# Patient Record
Sex: Female | Born: 1955
Health system: Western US, Academic
[De-identification: ages and names within clinical notes are randomized; demographics above are authoritative.]

---

## 2019-04-28 ENCOUNTER — Ambulatory Visit: Payer: PRIVATE HEALTH INSURANCE

## 2019-04-28 DIAGNOSIS — F908 Attention-deficit hyperactivity disorder, other type: Secondary | ICD-10-CM

## 2019-04-28 DIAGNOSIS — E7849 Other hyperlipidemia: Secondary | ICD-10-CM

## 2019-04-28 DIAGNOSIS — Z1231 Encounter for screening mammogram for malignant neoplasm of breast: Secondary | ICD-10-CM

## 2019-04-28 DIAGNOSIS — F321 Major depressive disorder, single episode, moderate: Secondary | ICD-10-CM

## 2019-04-28 MED ORDER — AMPHETAMINE-DEXTROAMPHETAMINE 15 MG PO TABS
15 mg | ORAL_TABLET | Freq: Three times a day (TID) | ORAL | 0 refills | Status: AC
Start: 2019-04-28 — End: ?

## 2019-04-28 NOTE — Progress Notes
[facility] INTERNAL MEDICINE & PEDIATRICS  [facility]  [address]  [place] [address]  Phone: [phone]  FAX: [phone]    Establish care note    Subjective:     CC: Establish Care and Medication Refill    Patient Active Problem List   Diagnosis   ? Hyperlipemia   ? Attention deficit disorder       HPI:  Tiffany Richards is a 63 y.o. female with the above issues, here for Establish Care and Medication Refill.    ADHD: on adderall 15 mg TID (before breakfast, at noon, and at 4pm). has been on this regimen since 2007. dx with ADHD at 63 yo by a psychiatrist. last fill at her previous PCP in [place] Dr. Gevena Mart either 01/2019 per care everywhere. Moved to [place] for husnabdwork. Going to be here for at least a year. Previous PCP passed, has not been on meds for last month however is unable to focus on her job. Requesting refill on medication    HLD: lipitor 20 mg qd    PHQ9 score 11, hx of son dying at a young age. No si/hi. Interested in talk therapy but not pharmaco therapy    Per patient, due for mmg and physical.     Review of Systems  A 14 point review of systems was completed and is negative except as above.    Social History  Social History     Socioeconomic History   ? Marital status: Married     Spouse name: Not on file   ? Number of children: Not on file   ? Years of education: Not on file   ? Highest education level: Not on file   Occupational History   ? Not on file   Social Needs   ? Financial resource strain: Not on file   ? Food insecurity:     Worry: Not on file     Inability: Not on file   ? Transportation needs:     Medical: Not on file     Non-medical: Not on file   Tobacco Use   ? Smoking status: Former Smoker   ? Smokeless tobacco: Never Used   Substance and Sexual Activity   ? Alcohol use: Not on file   ? Drug use: Not on file   ? Sexual activity: Not on file   Lifestyle   ? Physical activity:     Days per week: Not on file Minutes per session: Not on file   ? Stress: Not on file   Relationships   ? Social connections:     Talks on phone: Not on file     Gets together: Not on file     Attends religious service: Not on file     Active member of club or organization: Not on file     Attends meetings of clubs or organizations: Not on file     Relationship status: Not on file   Other Topics Concern   ? Not on file   Social History Narrative   ? Not on file       Medications/Supplements  Medications that the patient states to be currently taking   Medication Sig   ? amphetamine-dextroamphetamine (ADDERALL, 15MG ,) 15 mg tablet Take 1 tablet (15 mg total) by mouth three (3) times daily. Max Daily Amount: 45 mg   ? atorvastatin (LIPITOR) 20 mg tablet Take 1 tablet by mouth at bedtime .   ?  diclofenac 1% gel Apply topically four (4) times daily .   ? [DISCONTINUED] amphetamine-dextroamphetamine (ADDERALL, 15MG ,) 15 mg tablet Take 1 tablet by mouth three (3) times daily .       Allergies:  Patient has no allergy information on record.    Objective:     Physical Exam  BP 121/81  ~ Pulse 90  ~ Resp 18  ~ Ht 1.626 m (5' 4'')  ~ Wt 67.4 kg (148 lb 9.6 oz)  ~ SpO2 100%  ~ BMI 25.51 kg/m?      General: alert, well appearing, and in no distress.  Head: Atraumatic, normocephalic  Eyes: extraocular eye movements intact, sclera anicteric.  Nose/Oropharynx: wearing face mask  Heart: normal rate, regular rhythm, normal S1, S2, no murmurs, rubs, clicks or gallops.   Lungs: clear to auscultation, no wheezes, rales or rhonchi, symmetric air entry and normal work of breathing.  Abdomen: soft, nontender, nondistended, no masses or organomegaly  Neuro: alert, oriented, normal speech  Psych:  Normal affect, insight, and judgment.      Labs  No results found for any previous visit.       Studies  No imaging has been resulted in the last 365 days    Assessment/Plan:     Tiffany Richards is a 63 y.o. female patient with above medical problems who presents to clinic today to discuss, evaluate, and treat the following diagnoses:    Diagnoses and all orders for this visit:    Attention deficit hyperactivity disorder (ADHD), other type  -     POCT Urine Drug Screening (Multiple Classes) positive for amp and negative for other classes  -     amphetamine-dextroamphetamine (ADDERALL, 15MG ,) 15 mg tablet; Take 1 tablet (15 mg total) by mouth three (3) times daily. Max Daily Amount: 45 mg  - Denies illicit drug use. Above Rx sent. Pt return to clinic in 1 month for next refill.    Encounter for screening mammogram for breast cancer  -     Mammogram, screening, bilat breast; Future    Depression, major, single episode, moderate (HCC/RAF)  -     Referral to Psych [facility])  -  Denies SI/HI  - Will address at next visit    Other hyperlipidemia continue home meds.         Follow up in 1 month for refill on ADHD meds and for physical.    Future Appointments (if any - see below for last 10 appointments)  No future appointments.      The above plan of care, diagnosis, orders, and follow-up were discussed with the patient.  Questions related to this recommended plan of care were answered.    Fredderick Severance, MD  04/28/2019 at 11:44 AM

## 2019-04-28 NOTE — Patient Instructions
You have been referred by your primary care provider to [facility]), which is a clinic designed to support your emotional well-being and mental health so that you can thrive. [facility] works in partnership with your primary care provider.     There are three main components of treatment in [facility]:     ?    Psychiatry: where you can be evaluated and monitored for medications.   ?    Brief individual (or couples/family) psychotherapy: where you can participate in talk therapy, work on goals and discuss coping strategies.   ?    Group psychotherapy: where you can decrease your isolation and meet with a professional facilitator in a group with other individuals who may struggle with similar symptoms ([email or web address]).    Please allow 3 business days for your referral to be processed and then call [phone] to schedule your appointment.    Recognizing Suicide Warning Signs in Yourself    People who are thinking about suicide may not know they are depressed. Certain thoughts, feelings, and actions can be signals that let you know you may need help. The best thing you can do is watch for signs that you may be at risk. Then, ask for help. You can talk to your regular healthcare provider or seek help from a mental health provider.  Depression  Depression is a treatable illness. To know if depression is causing you to feel like ending your life, ask yourself:  ? Do I feel worthless, guilty, helpless, or hopeless?  ? Have I been feeling sad, down, or blue on most days?  ? Have I lost interest in my work or people I used to enjoy?  ? Do I have trouble sleeping or do I sleep too much?  ? Do I eat more or less than usual?  ? Do I feel tired, weak, and low on energy?  ? Do I feel restless and unable to sit still?  ? Do I have trouble thinking or making choices?  ? Do I cry more than usual?  ? Do I feel life isn't worth living?  Warning signs for suicide ? Thinking often about taking your life  ? Planning how you may attempt it  ? Talking or writing about committing suicide  ? Feeling that death is the only solution to your problems  ? Feeling a pressing need to make out your will or arrange your funeral  ? Giving away things you own  ? Participating in risky behaviors, such as sex with someone you don't know or drinking and driving  ? Buying a lethal weapon, such as a gun, or hoarding medicines that could be used in an over dose  If you notice any of these warning signs, call for help right away or go to your closest hospital emergency department. You can also call a mental health clinic or a 24-hour suicide crisis hotline for help and support. Search for local suicide prevention resources on your computer or look for the number in the white pages of your phone book under ''Suicide.'' In an emergency, if you are in immediate risk of harming yourself, call 911. For more information about depression:  ? [employer] of Mental Health866-615-6464www.nimh.nih.gov  ? National Suicide Prevention Lifeline800-819-649-0546 (800-273-TALK)www.suicidepreventionlifeline.org  ? [employer] on Mental Illness800-950-6264www.nami.org  ? Mental Health America800-969-6642www.nmha.org  ? National Suicide Hotline800-509 329 0638 (800-SUICIDE)   Date Last Reviewed: 07/11/2015  ? 2000-2018 [employer]. [address], [place], [place] [address]. All rights reserved.  This information is not intended as a substitute for professional medical care. Always follow your healthcare professional's instructions.

## 2019-05-13 ENCOUNTER — Inpatient Hospital Stay: Payer: PRIVATE HEALTH INSURANCE

## 2019-05-13 DIAGNOSIS — Z1231 Encounter for screening mammogram for malignant neoplasm of breast: Secondary | ICD-10-CM

## 2019-05-15 ENCOUNTER — Ambulatory Visit: Payer: PRIVATE HEALTH INSURANCE

## 2019-05-29 ENCOUNTER — Ambulatory Visit: Payer: PRIVATE HEALTH INSURANCE

## 2019-06-02 ENCOUNTER — Ambulatory Visit: Payer: PRIVATE HEALTH INSURANCE

## 2019-06-02 ENCOUNTER — Telehealth: Payer: PRIVATE HEALTH INSURANCE

## 2019-06-02 DIAGNOSIS — Z0000 Encounter for general adult medical examination without abnormal findings: Secondary | ICD-10-CM

## 2019-06-02 DIAGNOSIS — Z1159 Encounter for screening for other viral diseases: Secondary | ICD-10-CM

## 2019-06-02 DIAGNOSIS — F908 Attention-deficit hyperactivity disorder, other type: Secondary | ICD-10-CM

## 2019-06-02 DIAGNOSIS — E7849 Other hyperlipidemia: Secondary | ICD-10-CM

## 2019-06-02 DIAGNOSIS — Z114 Encounter for screening for human immunodeficiency virus [HIV]: Secondary | ICD-10-CM

## 2019-06-02 LAB — Differential Automated: MONOCYTE PERCENT, AUTO: 8.5 (ref 0.00–0.50)

## 2019-06-02 LAB — CBC: PLATELET COUNT, AUTO: 373 10*3/uL (ref 143–398)

## 2019-06-02 MED ORDER — AMPHETAMINE-DEXTROAMPHETAMINE 15 MG PO TABS
15 mg | ORAL_TABLET | Freq: Three times a day (TID) | ORAL | 0 refills | Status: AC
Start: 2019-06-02 — End: ?

## 2019-06-02 NOTE — Telephone Encounter
No auth req'd , patient has PPO.

## 2019-06-02 NOTE — Progress Notes
[facility] INTERNAL MEDICINE & PEDIATRICS  [facility]  [address]  [place] [address]  Phone: [phone]  FAX: [phone]    WELL WOMAN EXAM    Subjective:     CC: Well woman exam    Patient Active Problem List   Diagnosis   ? Hyperlipemia   ? Attention deficit disorder       HPI:  Tiffany Richards is a 63 y.o. female with the above issues, here for her annual physical.     ADHD: ADHD: on adderall 15 mg TID (before breakfast, at noon, and at 4pm). has been on this regimen since 2007. dx with ADHD at 63 yo by a psychiatrist. last fill at her previous PCP in [place] Dr. Gevena Mart either 01/2019 per care everywhere. Moved to [place] for work. Going to be here for at least a year. Previous PCP passed Requesting refill on medication. stable on current meds  denies any sx of heart racing, decreased sleep, decreased appetite, or decreased thirst.      Obstetric History/Family Planning  Menstrual History: PMP  Pregnancy History: G64P3, 3 misscarriage  Family history breast, cervical, and ovarian CA: paternal aunt breast cancer  Birth Control: tubal, hyseterectomy, no need for paps    C-scope 2 years ago, needs repeat in 3 years    Review of Systems  A 14 point review of systems is negative except as described above.    Medical History  She has a past medical history of Anxiety, Arthritis, and Hyperlipidemia.    Surgical History  She has a past surgical history that includes Cesarean section (08/27/1991); Joint replacement (10/2014); and Tubal ligation (08/27/1991).    Family History  Her family history includes Breast cancer in her mother; CABG in her father; Epilepsy in her brother.    Social History  Social History     Occupational History   ? Not on file   Tobacco Use   ? Smoking status: Former Smoker     Packs/day: 0.00     Years: 0.00     Pack years: 0.00   ? Smokeless tobacco: Never Used   ? Tobacco comment: 15 ppy   Substance and Sexual Activity ? Alcohol use: Not Currently     Comment: once a week   ? Drug use: Never   ? Sexual activity: Yes     Partners: Male     Birth control/protection: Female Sterilization     Social History     Social History Narrative   ? Not on file       Immunization History   Administered Date(s) Administered   ? Influenza vaccine IM quadrivalent (Afluria Quad) (PF) SYR (42 years of age and older) 05/02/2014, 05/13/2017   ? Tdap 06/12/2014   ? influenza vaccine IM cell culture quadrivalent (Flucelvax Quad) MDV (75 years of age and older) 05/28/2018   ? influenza vaccine IM quadrivalent (Fluarix Quad) (PF) SYR (37 months of age and older) 04/14/2019   ? influenza vaccine IM quadrivalent (Fluzone Quad) MDV (32 months of age and older) 06/09/2017   ? influenza vaccine IM trivalent (Fluvirin) MDV (52 years of age and older) 04/23/2013, 05/01/2015, 05/13/2017   ? influenza, unspecified formulation 04/14/2019, 04/14/2019       Not on File    Medications and Supplements  Medications that the patient states to be currently taking   Medication Sig   ? amphetamine-dextroamphetamine (ADDERALL, 15MG ,) 15 mg tablet Take 1 tablet (15  mg total) by mouth three (3) times daily. Max Daily Amount: 45 mg   ? atorvastatin (LIPITOR) 20 mg tablet Take 1 tablet by mouth at bedtime .   ? diclofenac 1% gel Apply topically four (4) times daily .   ? [DISCONTINUED] amphetamine-dextroamphetamine (ADDERALL, 15MG ,) 15 mg tablet Take 1 tablet (15 mg total) by mouth three (3) times daily. Max Daily Amount: 45 mg         Objective:     Physical Exam  BP 119/78  ~ Pulse 87  ~ Temp 36 ?C (96.8 ?F) (Temporal)  ~ Ht 1.626 m (5' 4'')  ~ Wt 68 kg (150 lb)  ~ SpO2 99%  ~ BMI 25.75 kg/m?     General: alert, well appearing, and in no distress.  Head: Atraumatic, normocephalic  Eyes: pupils equal and reactive, extraocular eye movements intact, sclera anicteric.  Nose/Oropharynx: wearing face mask Heart: normal rate, regular rhythm, normal S1, S2, no murmurs, rubs, clicks or gallops. Peripheral pulses: normal  Lungs: clear to auscultation, no wheezes, rales or rhonchi, symmetric air entry and normal work of breathing.  Abdomen: soft, nontender, nondistended, no masses or organomegaly  MSK: no joint tenderness, deformity or swelling.  Neuro: alert, oriented, normal speech, no focal findings or movement disorder noted  Psych:  Normal affect, insight, and judgment.      Labs  Office Visit on 04/28/2019   Component Date Value Ref Range Status   ? Marijuana, urine (Manual) 04/28/2019 neg   Final   ? Cocaine, urine (Manual) 04/28/2019 neg   Final   ? Opiates, urine (Manual) 04/28/2019 neg   Final   ? Methamphetamine, urine (Manual) 04/28/2019 neg   Final   ? Amphetamines, urine (Manual) 04/28/2019 pos   Final   ? Benzodiazepines, urine (Manual) 04/28/2019 neg   Final   ? Barbiturates, urine (Manual) 04/28/2019 neg   Final   ? Methadone, urine (Manual) 04/28/2019 neg   Final   ? Buprenorphine, urine (Manual) 04/28/2019 neg   Final   ? Tricyclic Antidepressants, Urine (* 04/28/2019 neg   Final   ? MDMA, Urine (Manual) 04/28/2019 neg   Final   ? Oxycodon, urine (Manual) 04/28/2019 neg   Final   ? PCP, Urine (Manual) 04/28/2019 neg   Final   ? Propoxyphene, Urine (Manual) 04/28/2019 neg   Final   ? Creatinine, Urine (Manual) 04/28/2019 neg   Final   ? Nitrite, Urine (Manual) 04/28/2019 neg   Final   ? pH, Urine (Manual) 04/28/2019 neg   Final   ? Bleach, Urine (Manual) 04/28/2019 neg   Final   ? Spec Grav, Ur, Manual 04/28/2019 1.015   Final     Studies  Mammo Tomosynthesis, Screening, Bilat Breast    Result Date: 05/14/2019 Benign-appearing nodules in both breasts are benign.  Routine screening mammogram in 1 year is recommended.  Based on a modified IBIS risk calculator, this patient has an approximate 8% lifetime risk of developing breast cancer.  BI-RADS Category 2: Benign Finding(s)  Report electronically signed by: Samuel Germany, M.D.     Report electronically signed by: Samuel Germany  05/14/2019 at 04:55:05 PM  Technologist: Peyton Bottoms                 Assessment:     Tiffany Richards is a 63 y.o. female patient with above medical problems who presents to clinic today to discuss, evaluate, and treat the following diagnoses:    Diagnoses and all orders for this visit:  Healthcare maintenance  -     Lipid Panel; Future  -     Hgb A1c; Future  -     Comprehensive Metabolic Panel; Future  -     CBC & Auto Differential; Future    Attention deficit hyperactivity disorder (ADHD), other type  -     amphetamine-dextroamphetamine (ADDERALL, 15MG ,) 15 mg tablet; Take 1 tablet (15 mg total) by mouth three (3) times daily. Max Daily Amount: 45 mg  - CURES checked and consistent. Denies illicit drug use. Above Rx sent. Pt return to clinic in 1 month for next refill. Can be telemed visit      Other hyperlipidemia continue home meds    Screening for HIV (human immunodeficiency virus)  -     HIV-1/2 Ag/Ab 4th Generation with Reflex Confirmation; Future    Need for hepatitis C screening test  -     HCV Antibody Screen with Reflex to Quantitative PCR/Genotype; Future    Follow up pending above    Healthcare maintenance as below:    Healthcare Maintenance Last Done Comments   Pap     Mammogram     Colonoscopy     Low dose CT Chest     Bone mineral density     Lipids     Diabetes     STIs     HIV     HepC       Immunizations Last Done Comments   Tdap     Pneumovax     Prevnar     Influenza     HPV     Zoster The above plan of care, diagnosis, orders, and follow-up were discussed with the patient.  Questions related to this recommended plan of care were answered.    Fredderick Severance, MD  06/02/2019 at 9:35 AM

## 2019-06-03 ENCOUNTER — Ambulatory Visit: Payer: PRIVATE HEALTH INSURANCE

## 2019-06-03 DIAGNOSIS — R7303 Prediabetes: Secondary | ICD-10-CM

## 2019-06-03 DIAGNOSIS — Z23 Encounter for immunization: Secondary | ICD-10-CM

## 2019-06-03 LAB — Comprehensive Metabolic Panel
ALBUMIN: 4.4 g/dL (ref 3.9–5.0)
ASPARTATE AMINOTRANSFERASE: 23 U/L (ref 13–47)

## 2019-06-03 LAB — HIV-1/2 Ag/Ab 4th Generation with Reflex Confirmation: HIV-1/2 AG/AB 4TH GENERATION WITH REFLEX CONFIRMATION: NONREACTIVE

## 2019-06-03 LAB — HCV Ab Screen: HCV ANTIBODY SCREEN: NONREACTIVE

## 2019-06-03 LAB — Hgb A1c: HGB A1C - HPLC: 5.9 — ABNORMAL HIGH (ref <5.7)

## 2019-06-03 LAB — Lipid Panel: TRIGLYCERIDES: 95 mg/dL (ref <150)

## 2019-06-24 ENCOUNTER — Institutional Professional Consult (permissible substitution): Payer: PRIVATE HEALTH INSURANCE

## 2019-06-24 DIAGNOSIS — Z23 Encounter for immunization: Secondary | ICD-10-CM

## 2019-06-30 ENCOUNTER — Telehealth: Payer: PRIVATE HEALTH INSURANCE

## 2019-06-30 DIAGNOSIS — F908 Attention-deficit hyperactivity disorder, other type: Secondary | ICD-10-CM

## 2019-06-30 DIAGNOSIS — E7849 Other hyperlipidemia: Secondary | ICD-10-CM

## 2019-06-30 MED ORDER — AMPHETAMINE-DEXTROAMPHETAMINE 15 MG PO TABS
15 mg | ORAL_TABLET | Freq: Three times a day (TID) | ORAL | 0 refills | Status: AC
Start: 2019-06-30 — End: ?

## 2019-06-30 MED ORDER — ATORVASTATIN CALCIUM 20 MG PO TABS
20 mg | ORAL_TABLET | Freq: Every evening | ORAL | 3 refills | Status: AC
Start: 2019-06-30 — End: ?

## 2019-06-30 NOTE — Progress Notes
[facility] INTERNAL MEDICINE & PEDIATRICS  [facility]  [address]  [place] [address]  Phone: [phone]  FAX: [phone]    VIDEO ENCOUNTER VISIT    Subjective:     CC: Medication Follow-Up    Patient Active Problem List   Diagnosis   ? Hyperlipemia   ? Attention deficit disorder   ? Prediabetes       Patient Consent to Telehealth Questionnaire   [facility] TELEHEALTH PRECHECKIN QUESTIONS 06/30/2019   By clicking ''I Agree'', I consent to the below:  I Agree     - I agree  to be treated via a video visit and acknowledge that I may be liable for any relevant copays or coinsurance depending on my insurance plan.  - I understand that this video visit is offered for my convenience and I am able to cancel and reschedule for an in-person appointment if I desire.  - I also acknowledge that sensitive medical information may be discussed during this video visit appointment and that it is my responsibility to locate myself in a location that ensures privacy to my own level of comfort.  - I also acknowledge that I should not be participating in a video visit in a way that could cause danger to myself or to those around me (such as driving or walking).  If my provider is concerned about my safety, I understand that they have the right to terminate the visit.       Patient Location: Home  Physician Location: [facility] [address], [place], [place] [address]    HPI:  Tiffany Richards is a 63 y.o. female with the above issues, here for Medication Follow-Up. ADHD: ADHD: on adderall 15 mg TID (before breakfast, at noon, and at 4pm). has been on this regimen?since 2007. dx with ADHD at?42?yo?by a psychiatrist. last fill at her previous PCP in [place] Dr. Gevena Mart either 01/2019 per care everywhere.?Moved to [place] for work. Going to be here for at least a year. Previous PCP passed. Established care with me on  10/19/20stable on current meds. denies any sx of heart racing, decreased sleep, decreased appetite, or decreased thirst.    HLD: needs refills on atorvastatin 20    Prediabetes: dx at last visit. Wanted to discuss after care.     Review of Systems  A 14 point review of systems was completed and is negative except as above.    Social History  Social History     Socioeconomic History   ? Marital status: Married     Spouse name: Not on file   ? Number of children: Not on file   ? Years of education: Not on file   ? Highest education level: Not on file   Occupational History   ? Not on file   Social Needs   ? Financial resource strain: Not on file   ? Food insecurity     Worry: Not on file     Inability: Not on file   ? Transportation needs     Medical: Not on file     Non-medical: Not on file   Tobacco Use   ? Smoking status: Former Smoker     Packs/day: 0.00     Years: 0.00     Pack years: 0.00   ? Smokeless tobacco: Never Used   ? Tobacco comment: 15 ppy   Substance and Sexual  Activity   ? Alcohol use: Not Currently     Comment: once a week   ? Drug use: Never   ? Sexual activity: Yes     Partners: Male     Birth control/protection: Female Sterilization   Lifestyle   ? Physical activity     Days per week: Not on file     Minutes per session: Not on file   ? Stress: Not on file   Relationships   ? Social Wellsite geologist on phone: Not on file     Gets together: Not on file     Attends religious service: Not on file     Active member of club or organization: Not on file     Attends meetings of clubs or organizations: Not on file Relationship status: Not on file   Other Topics Concern   ? Not on file   Social History Narrative   ? Not on file       Medications/Supplements  No outpatient medications have been marked as taking for the 06/30/19 encounter (Telemedicine) with Fredderick Severance., MD.       Allergies:  Patient has no allergy information on record.    Objective:     Physical Exam:      General: alert, well appearing, and in no distress.  Head: Atraumatic, normocephalic  Eyes: extraocular eye movements intact, sclera anicteric.  Nose: normal and patent, no discharge  Lungs:  normal work of breathing, speaking in full sentences  Neuro: alert, oriented, normal speech  Psych:  Normal affect, insight, and judgment.      Labs  Office Visit on 06/02/2019   Component Date Value Ref Range Status   ? HIV-1/2 Ag/Ab Screen 4th Generation 06/02/2019 Nonreactive  Nonreactive Final   ? HCV Ab Screen 06/02/2019 Nonreactive  Nonreactive Final   ? Sodium 06/02/2019 144  135 - 146 mmol/L Final   ? Potassium 06/02/2019 4.6  3.6 - 5.3 mmol/L Final   ? Chloride 06/02/2019 106  96 - 106 mmol/L Final   ? Total CO2 06/02/2019 27  20 - 30 mmol/L Final   ? Anion Gap 06/02/2019 11  8 - 19 mmol/L Final   ? Glucose 06/02/2019 100* 65 - 99 mg/dL Final   ? GFR Estimate for Non-African Ameri* 06/02/2019 >89  See GFR Additional Information mL/min/1.68m2 Final   ? GFR Estimate for African American 06/02/2019 >89  See GFR Additional Information mL/min/1.92m2 Final   ? GFR Additional Information 06/02/2019 See Comment   Final    GFR >89        Normal  GFR 60 - 89    Normal to mildly decreased  GFR 45 - 59    Mildly to moderately decreased  GFR 30 - 44    Moderately to severely decreased  GFR 15 - 29    Severely decreased  GFR <15        Kidney failure  The CKD-EPI equation was used to estimate GFR and assumes stable creatinine concentrations.  Results are in mL/min/1.73 square meters.   ? Creatinine 06/02/2019 0.67  0.60 - 1.30 mg/dL Final ? Urea Nitrogen 16/04/9603 13  7 - 22 mg/dL Final   ? Calcium 54/03/8118 9.0  8.6 - 10.4 mg/dL Final   ? Total Protein 06/02/2019 6.7  6.1 - 8.2 g/dL Final   ? Albumin 14/78/2956 4.4  3.9 - 5.0 g/dL Final   ? Bilirubin,Total 06/02/2019 0.4  0.1 - 1.2 mg/dL  Final   ? Alkaline Phosphatase 06/02/2019 78  37 - 113 U/L Final   ? Aspartate Aminotransferase 06/02/2019 23  13 - 47 U/L Final   ? Alanine Aminotransferase 06/02/2019 23  8 - 64 U/L Final   ? Hgb A1c - HPLC 06/02/2019 5.9* <5.7 % Final    For patients with diabetes, an A1c less than (<) or equal (=) to 7.0% is recommended for most patients, however the goal may be higher or lower depending on age and/or other medical problems.   For a diagnosis of diabetes, A1c greater than (>) or equal(=) to 6.5% indicates diabetes; values between 5.7% and 6.4% may indicate an increased risk of developing diabetes.   ? Cholesterol 06/02/2019 181  See Comment mg/dL Final    The significance of total cholesterol depends on the values of LDL, HDL, triglycerides and the clinical context. A patient-provider discussion may be considered.       ? Cholesterol,LDL,Calc 06/02/2019 98  <100 mg/dL Final    If LDL value falls outside of the designated range AND if  included in any of the following categories, a  patient-provider discussion is recommended.     Statin therapy is recommended for individuals:  1. with clinical atherosclerotic cardiovascular disease     irrespective of LDL levels;  2. with LDL > or = 190 mg/dL;  3. with diabetes, aged 40-75 years, with LDL between 70 and     189 mg/dL;  4. without any of the above but who have LDL between 70 and     189 mg/dL and an estimated 30-QMVH risk of     atherosclerotic cardiovascular disease > or = 7.5%     (consider statin therapy if estimated 10-year risk > or =     5.0%) (ACC/AHA 2013 Guidelines).   ? Cholesterol, HDL 06/02/2019 64  >50 mg/dL Final    If HDL cholesterol level falls outside of the designated range, a patient-provider discussion is recommended   ? Triglycerides 06/02/2019 95  <150 mg/dL Final    If Triglyceride level falls outside of the designated range,  a patient-provider discussion is recommended.     ? Non-HDL,Chol,Calc 06/02/2019 117  <130 mg/dL Final    If Non-HDL cholesterol level falls outside of the designated  range, a patient-provider discussion is recommended.   ? White Blood Cell Count 06/02/2019 7.15  4.16 - 9.95 x10E3/uL Final   ? Red Blood Cell Count 06/02/2019 4.69  3.96 - 5.09 x10E6/uL Final   ? Hemoglobin 06/02/2019 14.6  11.6 - 15.2 g/dL Final   ? Hematocrit 84/69/6295 45.2  34.9 - 45.2 % Final   ? Mean Corpuscular Volume 06/02/2019 96.4  79.3 - 98.6 fL Final   ? Mean Corpuscular Hemoglobin 06/02/2019 31.1  26.4 - 33.4 pg Final   ? MCH Concentration 06/02/2019 32.3  31.5 - 35.5 g/dL Final   ? Red Cell Distribution Width-SD 06/02/2019 45.4  36.9 - 48.3 fL Final   ? Red Cell Distribution Width-CV 06/02/2019 12.8  11.1 - 15.5 % Final   ? Platelet Count, Auto 06/02/2019 373  143 - 398 x10E3/uL Final   ? Mean Platelet Volume 06/02/2019 10.2  9.3 - 13.0 fL Final   ? Nucleated RBC%, automated 06/02/2019 0.0  No Ref. Range % Final    Percent Reference Range Not Reported per accrediting agency   ? Absolute Nucleated RBC Count 06/02/2019 0.00  0.00 - 0.00 x10E3/uL Final   ? Neutrophil Abs (Prelim) 06/02/2019 4.02  See  Absolute Neut Ct. x10E3/uL Final    This is a preliminary result.  If automated differential see Absolute Neut  Count or if manual differential see Absolute Neut Ct, Manual for final result.   ? Neutrophil Percent, Auto 06/02/2019 56.2  No Ref. Range % Final    Percent reference range not reported per accrediting agency.     ? Lymphocyte Percent, Auto 06/02/2019 31.5  No Ref. Range % Final    Percent reference range not reported per accrediting agency.     ? Monocyte Percent, Auto 06/02/2019 8.5  No Ref. Range % Final Percent reference range not reported per accrediting agency.     ? Eosinophil Percent, Auto 06/02/2019 2.5  No Ref. Range % Final    Percent reference range not reported per accrediting agency.     ? Basophil Percent, Auto 06/02/2019 1.0  No Ref. Range % Final    Percent reference range not reported per accrediting agency.     ? Immature Granulocytes% 06/02/2019 0.3  No Reference Range % Final    Percent reference range not reported per accrediting agency.     ? Absolute Neut Count 06/02/2019 4.02  1.80 - 6.90 x10E3/uL Final   ? Absolute Lymphocyte Count 06/02/2019 2.25  1.30 - 3.40 x10E3/uL Final   ? Absolute Mono Count 06/02/2019 0.61  0.20 - 0.80 x10E3/uL Final   ? Absolute Eos Count 06/02/2019 0.18  0.00 - 0.50 x10E3/uL Final   ? Absolute Baso Count 06/02/2019 0.07  0.00 - 0.10 x10E3/uL Final   ? Absolute Immature Gran Count 06/02/2019 0.02  0.00 - 0.04 x10E3/uL Final     Studies  Mammo Tomosynthesis, Screening, Bilat Breast    Result Date: 05/14/2019  Benign-appearing nodules in both breasts are benign.  Routine screening mammogram in 1 year is recommended.  Based on a modified IBIS risk calculator, this patient has an approximate 8% lifetime risk of developing breast cancer.  BI-RADS Category 2: Benign Finding(s)  Report electronically signed by: Samuel Germany, M.D.     Report electronically signed by: Samuel Germany  05/14/2019 at 04:55:05 PM  Technologist: Peyton Bottoms               Assessment/Plan:     Tiffany Richards is a 63 y.o. female patient with above medical problems who presents to clinic today to discuss, evaluate, and treat the following diagnoses:    Diagnoses and all orders for this visit:    Other hyperlipidemia  -     atorvastatin (LIPITOR) 20 mg tablet; Take 1 tablet (20 mg total) by mouth at bedtime.    Prediabetes lifestyle mods discussed at length    Attention deficit hyperactivity disorder (ADHD), other type -     amphetamine-dextroamphetamine (ADDERALL, 15MG ,) 15 mg tablet; Take 1 tablet (15 mg total) by mouth three (3) times daily. Max Daily Amount: 45 mg  -     amphetamine-dextroamphetamine 15 mg tablet; Take 1 tablet (15 mg total) by mouth three (3) times daily. Max Daily Amount: 45 mg  -     amphetamine-dextroamphetamine 15 mg tablet; Take 1 tablet (15 mg total) by mouth three (3) times daily. Max Daily Amount: 45 mg  - CURES checked and consistent. Denies illicit drug use. Above Rx sent. Pt return to clinic in 3 month for next refill.    Time spent for video encounter both face to face, in preparation for, as well as coordination of care: 15 minutes.    Future Appointments (if any - see below  for last 10 appointments)  No future appointments.      The above plan of care, diagnosis, orders, and follow-up were discussed with the patient.  Questions related to this recommended plan of care were answered.    Fredderick Severance, MD  06/30/2019 at 9:14 AM

## 2019-07-30 MED ORDER — AMPHETAMINE-DEXTROAMPHETAMINE 15 MG PO TABS
15 mg | ORAL_TABLET | Freq: Three times a day (TID) | ORAL | 0 refills
Start: 2019-07-30 — End: ?

## 2019-07-31 MED ORDER — AMPHETAMINE-DEXTROAMPHETAMINE 15 MG PO TABS
15 mg | ORAL_TABLET | Freq: Three times a day (TID) | ORAL | 0 refills
Start: 2019-07-31 — End: ?

## 2019-08-27 ENCOUNTER — Ambulatory Visit: Payer: PRIVATE HEALTH INSURANCE

## 2019-08-28 ENCOUNTER — Institutional Professional Consult (permissible substitution): Payer: PRIVATE HEALTH INSURANCE

## 2019-08-28 DIAGNOSIS — Z23 Encounter for immunization: Secondary | ICD-10-CM

## 2019-09-24 ENCOUNTER — Ambulatory Visit: Payer: PRIVATE HEALTH INSURANCE

## 2019-09-24 DIAGNOSIS — M25562 Pain in left knee: Secondary | ICD-10-CM

## 2019-09-24 DIAGNOSIS — F908 Attention-deficit hyperactivity disorder, other type: Secondary | ICD-10-CM

## 2019-09-24 DIAGNOSIS — G8929 Other chronic pain: Secondary | ICD-10-CM

## 2019-09-24 MED ORDER — AMPHETAMINE-DEXTROAMPHETAMINE 15 MG PO TABS
15 mg | ORAL_TABLET | Freq: Three times a day (TID) | ORAL | 0 refills | Status: AC
Start: 2019-09-24 — End: ?

## 2019-09-24 NOTE — Patient Instructions
Please call the number below to set up an appointment to see an orthopedics specialist.     [facility]  [address], [address]  [place], [place] [address]  Phone: [phone]

## 2019-09-24 NOTE — Progress Notes
[facility] INTERNAL MEDICINE & PEDIATRICS  [facility]  [address]  [place] [address]  Phone: [phone]  FAX: [phone]    PROGRESS NOTE    Subjective:     CC: Medication Follow-Up    Patient Active Problem List   Diagnosis   ? Hyperlipemia   ? Attention deficit disorder   ? Prediabetes   ? Former smoker       HPI:  Tiffany Richards is a 64 y.o. female with the above issues, here for Medication Follow-Up.    ADHD: ADHD: on adderall 15 mg TID (before breakfast, at noon, and at 4pm). has been on this regimen since 2007. dx with ADHD at 64 yo by a psychiatrist. her previous PCP in [place] Dr. Gevena Mart provided her rx on 01/2019 per care everywhere. Moved to [place] for work. Going to be here for at least a year. Previous PCP passed. Established care with me on 04/28/19. stable on current meds. denies any sx of heart racing, decreased sleep    PolyOA of bilateral knee: previously followed with ortho outside of [place]. Left knee pain has worsened over last month. Wants to establish care with ortho. Denies any other compllaints.     Review of Systems  A 14 point review of systems was completed and is negative except as above.    Social History  Social History     Socioeconomic History   ? Marital status: Married     Spouse name: Not on file   ? Number of children: Not on file   ? Years of education: Not on file   ? Highest education level: Not on file   Occupational History   ? Not on file   Social Needs   ? Financial resource strain: Not on file   ? Food insecurity     Worry: Not on file     Inability: Not on file   ? Transportation needs     Medical: Not on file     Non-medical: Not on file   Tobacco Use   ? Smoking status: Former Smoker     Packs/day: 0.00     Years: 0.00     Pack years: 0.00   ? Smokeless tobacco: Never Used   ? Tobacco comment: 15 ppy   Substance and Sexual Activity   ? Alcohol use: Not Currently     Comment: once a week   ? Drug use: Never   ? Sexual activity: Yes     Partners: Male     Birth control/protection: Female Sterilization   Lifestyle   ? Physical activity     Days per week: Not on file     Minutes per session: Not on file   ? Stress: Not on file   Relationships   ? Social Wellsite geologist on phone: Not on file     Gets together: Not on file     Attends religious service: Not on file     Active member of club or organization: Not on file     Attends meetings of clubs or organizations: Not on file     Relationship status: Not on file   Other Topics Concern   ? Not on file   Social History Narrative   ? Not on file       Medications/Supplements  Medications that the patient states to be currently taking   Medication Sig   ? amphetamine-dextroamphetamine (  ADDERALL, 15MG ,) 15 mg tablet Take 1 tablet (15 mg total) by mouth three (3) times daily. Max Daily Amount: 45 mg   ? atorvastatin (LIPITOR) 20 mg tablet Take 1 tablet (20 mg total) by mouth at bedtime.   ? diclofenac 1% gel Apply topically four (4) times daily .   ? [DISCONTINUED] amphetamine-dextroamphetamine (ADDERALL, 15MG ,) 15 mg tablet Take 1 tablet (15 mg total) by mouth three (3) times daily. Max Daily Amount: 45 mg       Allergies:  Morphine, Hydrocodone, and Sulfa antibiotics    Objective:     Physical Exam  BP 119/81  ~ Pulse (!) 102  ~ Ht 5' 4'' (1.626 m)  ~ Wt 153 lb (69.4 kg)  ~ SpO2 99%  ~ BMI 26.26 kg/m?      General: alert, well appearing, and in no distress.  Head: Atraumatic, normocephalic  Eyes: extraocular eye movements intact, sclera anicteric.  Nose/Oropharynx: wearing face mask  Heart: normal rate, regular rhythm, normal S1, S2, no murmurs, rubs, clicks or gallops.   Lungs: clear to auscultation, no wheezes, rales or rhonchi, symmetric air entry and normal work of breathing.  Abdomen: soft, nontender, nondistended, no masses or organomegaly  Neuro: alert, oriented, normal speech  Psych:  Normal affect, insight, and judgment.  Labs  Office Visit on 06/02/2019   Component Date Value Ref Range Status   ? HIV-1/2 Ag/Ab Screen 4th Generation 06/02/2019 Nonreactive  Nonreactive Final   ? HCV Ab Screen 06/02/2019 Nonreactive  Nonreactive Final   ? Sodium 06/02/2019 144  135 - 146 mmol/L Final   ? Potassium 06/02/2019 4.6  3.6 - 5.3 mmol/L Final   ? Chloride 06/02/2019 106  96 - 106 mmol/L Final   ? Total CO2 06/02/2019 27  20 - 30 mmol/L Final   ? Anion Gap 06/02/2019 11  8 - 19 mmol/L Final   ? Glucose 06/02/2019 100* 65 - 99 mg/dL Final   ? GFR Estimate for Non-African Ameri* 06/02/2019 >89  See GFR Additional Information mL/min/1.95m2 Final   ? GFR Estimate for African American 06/02/2019 >89  See GFR Additional Information mL/min/1.19m2 Final   ? GFR Additional Information 06/02/2019 See Comment   Final    GFR >89        Normal  GFR 60 - 89    Normal to mildly decreased  GFR 45 - 59    Mildly to moderately decreased  GFR 30 - 44    Moderately to severely decreased  GFR 15 - 29    Severely decreased  GFR <15        Kidney failure  The CKD-EPI equation was used to estimate GFR and assumes stable creatinine concentrations.  Results are in mL/min/1.73 square meters.   ? Creatinine 06/02/2019 0.67  0.60 - 1.30 mg/dL Final   ? Urea Nitrogen 06/02/2019 13  7 - 22 mg/dL Final   ? Calcium 29/56/2130 9.0  8.6 - 10.4 mg/dL Final   ? Total Protein 06/02/2019 6.7  6.1 - 8.2 g/dL Final   ? Albumin 86/57/8469 4.4  3.9 - 5.0 g/dL Final   ? Bilirubin,Total 06/02/2019 0.4  0.1 - 1.2 mg/dL Final   ? Alkaline Phosphatase 06/02/2019 78  37 - 113 U/L Final   ? Aspartate Aminotransferase 06/02/2019 23  13 - 47 U/L Final   ? Alanine Aminotransferase 06/02/2019 23  8 - 64 U/L Final   ? Hgb A1c - HPLC 06/02/2019 5.9* <5.7 % Final    For  patients with diabetes, an A1c less than (<) or equal (=) to 7.0% is recommended for most patients, however the goal may be higher or lower depending on age and/or other medical problems.   For a diagnosis of diabetes, A1c greater than (>) or equal(=) to 6.5% indicates diabetes; values between 5.7% and 6.4% may indicate an increased risk of developing diabetes.   ? Cholesterol 06/02/2019 181  See Comment mg/dL Final    The significance of total cholesterol depends on the values of LDL, HDL, triglycerides and the clinical context. A patient-provider discussion may be considered.       ? Cholesterol,LDL,Calc 06/02/2019 98  <100 mg/dL Final    If LDL value falls outside of the designated range AND if  included in any of the following categories, a  patient-provider discussion is recommended.     Statin therapy is recommended for individuals:  1. with clinical atherosclerotic cardiovascular disease     irrespective of LDL levels;  2. with LDL > or = 190 mg/dL;  3. with diabetes, aged 40-75 years, with LDL between 70 and     189 mg/dL;  4. without any of the above but who have LDL between 70 and     189 mg/dL and an estimated 29-FAOZ risk of     atherosclerotic cardiovascular disease > or = 7.5%     (consider statin therapy if estimated 10-year risk > or =     5.0%) (ACC/AHA 2013 Guidelines).   ? Cholesterol, HDL 06/02/2019 64  >50 mg/dL Final    If HDL cholesterol level falls outside of the designated  range, a patient-provider discussion is recommended   ? Triglycerides 06/02/2019 95  <150 mg/dL Final    If Triglyceride level falls outside of the designated range,  a patient-provider discussion is recommended.     ? Non-HDL,Chol,Calc 06/02/2019 117  <130 mg/dL Final    If Non-HDL cholesterol level falls outside of the designated  range, a patient-provider discussion is recommended.   ? White Blood Cell Count 06/02/2019 7.15  4.16 - 9.95 x10E3/uL Final   ? Red Blood Cell Count 06/02/2019 4.69  3.96 - 5.09 x10E6/uL Final   ? Hemoglobin 06/02/2019 14.6  11.6 - 15.2 g/dL Final   ? Hematocrit 30/86/5784 45.2  34.9 - 45.2 % Final   ? Mean Corpuscular Volume 06/02/2019 96.4  79.3 - 98.6 fL Final   ? Mean Corpuscular Hemoglobin 06/02/2019 31.1  26.4 - 33.4 pg Final   ? MCH Concentration 06/02/2019 32.3  31.5 - 35.5 g/dL Final   ? Red Cell Distribution Width-SD 06/02/2019 45.4  36.9 - 48.3 fL Final   ? Red Cell Distribution Width-CV 06/02/2019 12.8  11.1 - 15.5 % Final   ? Platelet Count, Auto 06/02/2019 373  143 - 398 x10E3/uL Final   ? Mean Platelet Volume 06/02/2019 10.2  9.3 - 13.0 fL Final   ? Nucleated RBC%, automated 06/02/2019 0.0  No Ref. Range % Final    Percent Reference Range Not Reported per accrediting agency   ? Absolute Nucleated RBC Count 06/02/2019 0.00  0.00 - 0.00 x10E3/uL Final   ? Neutrophil Abs (Prelim) 06/02/2019 4.02  See Absolute Neut Ct. x10E3/uL Final    This is a preliminary result.  If automated differential see Absolute Neut  Count or if manual differential see Absolute Neut Ct, Manual for final result.   ? Neutrophil Percent, Auto 06/02/2019 56.2  No Ref. Range % Final    Percent reference range not  reported per accrediting agency.     ? Lymphocyte Percent, Auto 06/02/2019 31.5  No Ref. Range % Final    Percent reference range not reported per accrediting agency.     ? Monocyte Percent, Auto 06/02/2019 8.5  No Ref. Range % Final    Percent reference range not reported per accrediting agency.     ? Eosinophil Percent, Auto 06/02/2019 2.5  No Ref. Range % Final    Percent reference range not reported per accrediting agency.     ? Basophil Percent, Auto 06/02/2019 1.0  No Ref. Range % Final    Percent reference range not reported per accrediting agency.     ? Immature Granulocytes% 06/02/2019 0.3  No Reference Range % Final    Percent reference range not reported per accrediting agency.     ? Absolute Neut Count 06/02/2019 4.02  1.80 - 6.90 x10E3/uL Final   ? Absolute Lymphocyte Count 06/02/2019 2.25  1.30 - 3.40 x10E3/uL Final   ? Absolute Mono Count 06/02/2019 0.61  0.20 - 0.80 x10E3/uL Final   ? Absolute Eos Count 06/02/2019 0.18  0.00 - 0.50 x10E3/uL Final   ? Absolute Baso Count 06/02/2019 0.07  0.00 - 0.10 x10E3/uL Final   ? Absolute Immature Gran Count 06/02/2019 0.02  0.00 - 0.04 x10E3/uL Final       Studies  Mammo Tomosynthesis, Screening, Bilat Breast    Result Date: 05/14/2019  Benign-appearing nodules in both breasts are benign.  Routine screening mammogram in 1 year is recommended.  Based on a modified IBIS risk calculator, this patient has an approximate 8% lifetime risk of developing breast cancer.  BI-RADS Category 2: Benign Finding(s)  Report electronically signed by: Samuel Germany, M.D.     Report electronically signed by: Samuel Germany  05/14/2019 at 04:55:05 PM  Technologist: Peyton Bottoms             Assessment/Plan:     Tiffany Richards is a 64 y.o. female patient with above medical problems who presents to clinic today to discuss, evaluate, and treat the following diagnoses:    Diagnoses and all orders for this visit:    Attention deficit hyperactivity disorder (ADHD), other type  -     amphetamine-dextroamphetamine (ADDERALL, 15MG ,) 15 mg tablet; Take 1 tablet (15 mg total) by mouth three (3) times daily. Max Daily Amount: 45 mg  -     amphetamine-dextroamphetamine 15 mg tablet; Take 1 tablet (15 mg total) by mouth three (3) times daily. Max Daily Amount: 45 mg  -     amphetamine-dextroamphetamine 15 mg tablet; Take 1 tablet (15 mg total) by mouth three (3) times daily. Max Daily Amount: 45 mg  - CURES checked and consistent. Denies illicit drug use. Above Rx sent. Pt return to clinic in 3 months for next refill.    Chronic pain of left knee  -     Referral to Orthopaedic Surgery      I spent a total of 15 minutes face to face with the patient of which greater than 50% of that time was spent in counseling/coordination.  Topics of my discussion are in my note.        Future Appointments (if any - see below for last 10 appointments)  No future appointments.      The above plan of care, diagnosis, orders, and follow-up were discussed with the patient.  Questions related to this recommended plan of care were answered.    Fredderick Severance, MD  09/24/2019 at  1:10 PM

## 2019-10-15 ENCOUNTER — Ambulatory Visit: Payer: PRIVATE HEALTH INSURANCE

## 2019-10-15 DIAGNOSIS — Z23 Encounter for immunization: Secondary | ICD-10-CM

## 2019-12-10 ENCOUNTER — Ambulatory Visit: Payer: PRIVATE HEALTH INSURANCE

## 2019-12-10 DIAGNOSIS — K5909 Other constipation: Secondary | ICD-10-CM

## 2019-12-10 DIAGNOSIS — F908 Attention-deficit hyperactivity disorder, other type: Secondary | ICD-10-CM

## 2019-12-10 MED ORDER — AMPHETAMINE-DEXTROAMPHETAMINE 15 MG PO TABS
15 mg | ORAL_TABLET | Freq: Three times a day (TID) | ORAL | 0 refills | Status: AC
Start: 2019-12-10 — End: ?

## 2019-12-10 MED ORDER — POLYETHYLENE GLYCOL 3350 17 GM/SCOOP PO POWD
17 g | Freq: Every day | ORAL | 1 refills | Status: AC | PRN
Start: 2019-12-10 — End: ?

## 2019-12-10 NOTE — Patient Instructions
I regret to inform you that I am leaving [facility] and moving back to [place] where my family is original from.  Below is a list of amazing primary care doctors in the [facility] area that I would recommend establishing care with. I do highly suggest you schedule appointments to establish care with them as soon as possible to ensure you do not have a lapse in your healthcare.  Please establish care with a new primary care doctor at the end of July or beginning of August 2021 to ensure you will not have a lapse in your ADHD medication refills.     [facility] in [place] [phone] [phone])  ???         Lavone Neri, MD ??? Family Medicine (available for adult primary care)  ???         Gillis Ends, MD ??? Internal Medicine & Pediatrics (available for pediatric and adult primary care)  ???         Mindi Slicker, MD ??? Internal Medicine & Pediatrics (available for pediatric primary care)     [facility] in [place] [phone] [facility] [address])  ???         Ileana Roup, DO ??? Family Medicine (available for pediatric and adult primary care)  ???         Baldo Ash, MD ??? Family Medicine (available for pediatric and adult primary care)  ???         Chrissie Noa, MD ??? Internal Medicine (available for adult primary care)  ???         Chana Bode, MD ??? Internal Medicine (available for adult primary care)      Constipation (Adult)  Constipation means that you have bowel movements that are less frequent than usual. Stools often become very hard and difficult to pass.  Constipation is very common. At some point in life, it affects almost everyone. Since everyone's bowel habits are different, what is constipation to one person may not be to another. Your healthcare provider may do tests to diagnose constipation. It depends on what he or she finds when evaluating you.    Symptoms of constipation include:  ??? Abdominal pain  ??? Bloating  ??? Vomiting  ??? Painful bowel movements  ??? Itching, swelling, bleeding, or pain around the anus  Causes  Constipation can have many causes. These include:  ??? Diet low in fiber  ??? Too much dairy  ??? Not drinking enough liquids  ??? Lack of exercise or physical activity (especially true for older adults)  ??? Changes in lifestyle or daily routine, including pregnancy, aging, work, and travel  ??? Frequent use or misuse of laxatives  ??? Ignoring the urge to have a bowel movement or delaying it until later  ??? Medicines, such as certain prescription pain medicines, iron supplements, antacids, certain antidepressants, and calcium supplements  ??? Diseases like irritable bowel syndrome, bowel obstructions, stroke, diabetes, thyroid disease, Parkinson disease, hemorrhoids, and colon cancer  Complications  Potential complications of constipation can include:  ??? Hemorrhoids  ??? Rectal bleeding from hemorrhoids or anal fissures (skin tears)  ??? Hernias  ??? Dependency on laxatives  ??? Chronic constipation  ??? Fecal impaction, a severe form of constipation in which a large amount of hard stool is in your rectum that you can't pass  ??? Bowel obstruction or perforation  Home care  All treatment should be done after talking with your healthcare provider. This is  especially true if you have another medical problems, are taking prescription medicines, or are an older adult. Treatment most often involves lifestyle changes. You may also need medicines. Your healthcare provider will tell you which will work best for you. Follow the advice below to help avoid this problem in the future.  Lifestyle changes  These lifestyle changes can help prevent constipation:  ??? Diet. Eat a high-fiber diet, with fresh fruit and vegetables, and reduce dairy intake, meats, and processed foods  ??? Fluids. It's important to get enough fluids each day. Drink plenty of water when you eat more fiber. If you are on diet that limits the amount of fluid you can have, talk about this with your healthcare provider.  ??? Regular exercise. Check with your healthcare provider first.  Medicines  Take any medicines as directed. Some laxatives are safe to use only every now and then. Others can be taken on a regular basis. While laxatives don't cause bowel dependence, they are treating the symptoms. So your constipation may return if you don't make other changes. Talk with your healthcare provider or pharmacist if you have questions.  Prescription pain medicines can cause constipation. If you are taking this kind of medicine, ask your healthcare provider if you should also take a stool softener.  Medicines you may take to treat constipation include:  ??? Fiber supplements  ??? Stool softeners  ??? Laxatives  ??? Enemas  ??? Rectal suppositories  Follow-up care  Follow up with your healthcare provider if symptoms don't get better in the next few days. You may need to have more tests or see a specialist.  Call 911  Call 911 if any of these occur:  ??? Trouble breathing  ??? Stiff, rigid abdomen that is severely painful to touch  ??? Confusion  ??? Fainting or loss of consciousness  ??? Rapid heart rate  ??? Chest pain  When to seek medical advice  Call your healthcare provider right away if any of these occur:  ??? Fever of 100.4???F (38???C) or higher, or as directed by your healthcare provider  ??? Failure to resume normal bowel movements  ??? Pain in your abdomen or back gets worse  ??? Nausea or vomiting  ??? Swelling in your abdomen  ??? Blood in the stool  ??? Black, tarry stool  ??? Involuntary weight loss  ??? Weakness  Date Last Reviewed: 12/08/2016  ??? 2000-2018 [employer], [place]. [address], [place], [place] [address]. All rights reserved. This information is not intended as a substitute for professional medical care. Always follow your healthcare professional's instructions.        Eating a High-Fiber Diet  Fiber is what gives strength and structure to plants. Most grains, beans, vegetables, and fruits contain fiber. Foods rich in fiber are often low in calories and fat, and they fill you up more. They may also reduce your risks for certain health problems. To find out the amount of fiber in canned, packaged, or frozen foods, read the Nutrition Facts label. It tells you how much fiber is in one serving.    Types of fiber and their benefits  There are two types of fiber: insoluble and soluble. They both aid digestion and help you maintain a healthy weight.  ??? Insoluble fiber. This is found in whole grains, cereals, certain fruits and vegetables such as apple skin, corn, and carrots. Insoluble fiber may prevent constipation and reduce the risk for certain types of cancer. It is called insoluble because  it does not dissolve in water.  ??? Soluble fiber. This type of fiber is in oats, beans, and certain fruits and vegetables such as strawberries and peas. Soluble fiber can reduce cholesterol, which may help lower the risk for heart disease. It also helps control blood sugar levels.  Look for high-fiber foods  Try these foods to add fiber to your diet:  ??? Whole-grain breads and cereals. Try to eat 6 to 8 ounces a day. Include wheat and oat bran cereals, whole-wheat muffins or toast, and corn tortillas in your meals.  ??? Fruits. Try to eat 2 cups a day. Apples, oranges, strawberries, pears, and bananas are good sources. (Note: Fruit juice is low in fiber.)  ??? Vegetables. Try to eat at least 2.5 cups a day. Add asparagus, carrots, broccoli, peas, and corn to your meals.  ??? Beans. One cup of cooked lentils gives you over 15 grams of fiber. Try navy beans, lentils, and chickpeas.  ??? Seeds. A small handful of seeds gives you about 3 grams of fiber. Try sunflower or chia seeds.  Keep track of your fiber  Keep track of how much fiber you eat. Start by reading food labels. Then eat a variety of foods high in fiber. As you start to eat more fiber, ask your healthcare provider how much water you should be drinking to keep your digestive system working smoothly.  Aim for a certain amount of fiber in your diet each day. If you are a woman, that amount is between 25 and 28 grams per day. Men should aim for 30 to 33 grams per day. After age 9, your daily fiber needs drop to 22 grams for women and 28 grams for men.  Before you reach for the fiber supplements, think about this. Fiber is found naturally in healthy whole foods. It gives you that feeling of fullness after you eat. Taking fiber supplements or eating fiber-enriched foods will not give you this full feeling.  Your fiber intake is a good measure for the quality of your overall diet. If you are missing out on your daily amount of fiber, you may be lacking other important nutrients as well.  Date Last Reviewed: 12/09/2015  ??? 2000-2018 [employer], [place]. [address] [place], [place] [address]. All rights reserved. This information is not intended as a substitute for professional medical care. Always follow your healthcare professional's instructions.

## 2019-12-10 NOTE — Progress Notes
[facility] INTERNAL MEDICINE & PEDIATRICS  [facility]  [address] [place]  [address]  [place] [address]  Phone: [phone]  FAX: [phone]    PROGRESS NOTE    Subjective:     CC: Follow-up    Patient Active Problem List   Diagnosis   ??? Hyperlipemia   ??? Attention deficit disorder   ??? Prediabetes   ??? Former smoker       HPI:  Tiffany Richards is a 64 y.o. female with the above issues, here for Follow-up.    ADHD: ADHD: on adderall 15 mg TID (before breakfast, at noon, and at 4pm). has been on this regimen since 2007. dx with ADHD at 64 yo by a psychiatrist. her previous PCP in [place] Dr. Gevena Mart provided her rx on 01/2019 per care everywhere. Moved to [place] for work. Going to be here for at least a year. Previous PCP passed. Established care with me on 04/28/19. stable on current meds. denies any sx of heart racing, decreased sleep    Constipation: on and off for several years. Has hard pebble like stool twice a week. No pain or discomfort. No hematemesis, dysphagia/odynophagia, weight loss, melenotic or bloody stools.       Review of Systems  A 14 point review of systems was completed and is negative except as above.    Social History  Social History     Socioeconomic History   ??? Marital status: Married     Spouse name: Not on file   ??? Number of children: Not on file   ??? Years of education: Not on file   ??? Highest education level: Not on file   Occupational History   ??? Not on file   Social Needs   ??? Financial resource strain: Not on file   ??? Food insecurity     Worry: Not on file     Inability: Not on file   ??? Transportation needs     Medical: Not on file     Non-medical: Not on file   Tobacco Use   ??? Smoking status: Former Smoker     Packs/day: 0.00     Years: 0.00     Pack years: 0.00   ??? Smokeless tobacco: Never Used   ??? Tobacco comment: 15 ppy   Substance and Sexual Activity   ??? Alcohol use: Not Currently     Comment: once a week   ??? Drug use: Never   ??? Sexual activity: Yes Partners: Male     Birth control/protection: Female Sterilization   Lifestyle   ??? Physical activity     Days per week: Not on file     Minutes per session: Not on file   ??? Stress: Not on file   Relationships   ??? Social Wellsite geologist on phone: Not on file     Gets together: Not on file     Attends religious service: Not on file     Active member of club or organization: Not on file     Attends meetings of clubs or organizations: Not on file     Relationship status: Not on file   Other Topics Concern   ??? Not on file   Social History Narrative   ??? Not on file       Medications/Supplements  Medications that the patient states to be currently taking   Medication Sig   ??? amoxicillin 500 mg capsule Take  500 mg by mouth.   ??? amphetamine-dextroamphetamine 15 mg tablet Take 1 tablet (15 mg total) by mouth three (3) times daily. Max Daily Amount: 45 mg   ??? ascorbic acid 500 mg tablet Take 500 mg by mouth.   ??? atorvastatin (LIPITOR) 20 mg tablet Take 1 tablet (20 mg total) by mouth at bedtime.   ??? Calcium Carbonate-Vitamin D 600-400 MG-UNIT per tablet Take 1 tablet by mouth.   ??? coenzyme Q10 200 mg capsule Take 200 mg by mouth.   ??? diclofenac 1% gel Apply topically four (4) times daily .   ??? ibuprofen 800 mg tablet Take 800 mg by mouth.   ??? Multiple Vitamin (THERA-TABS) TABS Take 1 tablet by mouth.   ??? vitamin E 400 unit capsule Take 400 Units by mouth.   ??? [DISCONTINUED] amphetamine-dextroamphetamine (ADDERALL, 15MG ,) 15 mg tablet Take 1 tablet (15 mg total) by mouth three (3) times daily. Max Daily Amount: 45 mg   ??? [DISCONTINUED] amphetamine-dextroamphetamine 15 mg tablet Take 1 tablet (15 mg total) by mouth three (3) times daily. Max Daily Amount: 45 mg       Allergies:  Sulfa antibiotics, Morphine, and Hydrocodone    Objective:     Physical Exam  BP 116/83  ~ Pulse 68      General: alert, well appearing, and in no distress.  Head: Atraumatic, normocephalic  Eyes: extraocular eye movements intact, sclera anicteric. Nose/Mouth: mask in place  Neuro: alert, oriented, normal speech  Psych:  Normal affect, insight, and judgment.      Labs  Office Visit on 06/02/2019   Component Date Value Ref Range Status   ??? HIV-1/2 Ag/Ab Screen 4th Generation 06/02/2019 Nonreactive  Nonreactive Final   ??? HCV Ab Screen 06/02/2019 Nonreactive  Nonreactive Final   ??? Sodium 06/02/2019 144  135 - 146 mmol/L Final   ??? Potassium 06/02/2019 4.6  3.6 - 5.3 mmol/L Final   ??? Chloride 06/02/2019 106  96 - 106 mmol/L Final   ??? Total CO2 06/02/2019 27  20 - 30 mmol/L Final   ??? Anion Gap 06/02/2019 11  8 - 19 mmol/L Final   ??? Glucose 06/02/2019 100* 65 - 99 mg/dL Final   ??? GFR Estimate for Non-African Ameri* 06/02/2019 >89  See GFR Additional Information mL/min/1.72m2 Final   ??? GFR Estimate for African American 06/02/2019 >89  See GFR Additional Information mL/min/1.68m2 Final   ??? GFR Additional Information 06/02/2019 See Comment   Final    GFR >89        Normal  GFR 60 - 89    Normal to mildly decreased  GFR 45 - 59    Mildly to moderately decreased  GFR 30 - 44    Moderately to severely decreased  GFR 15 - 29    Severely decreased  GFR <15        Kidney failure  The CKD-EPI equation was used to estimate GFR and assumes stable creatinine concentrations.  Results are in mL/min/1.73 square meters.   ??? Creatinine 06/02/2019 0.67  0.60 - 1.30 mg/dL Final   ??? Urea Nitrogen 06/02/2019 13  7 - 22 mg/dL Final   ??? Calcium 16/04/9603 9.0  8.6 - 10.4 mg/dL Final   ??? Total Protein 06/02/2019 6.7  6.1 - 8.2 g/dL Final   ??? Albumin 54/03/8118 4.4  3.9 - 5.0 g/dL Final   ??? Bilirubin,Total 06/02/2019 0.4  0.1 - 1.2 mg/dL Final   ??? Alkaline Phosphatase 06/02/2019 78  37 -  113 U/L Final   ??? Aspartate Aminotransferase 06/02/2019 23  13 - 47 U/L Final   ??? Alanine Aminotransferase 06/02/2019 23  8 - 64 U/L Final   ??? Hgb A1c - HPLC 06/02/2019 5.9* <5.7 % Final    For patients with diabetes, an A1c less than (<) or equal (=) to 7.0% is recommended for most patients, however the goal may be higher or lower depending on age and/or other medical problems.   For a diagnosis of diabetes, A1c greater than (>) or equal(=) to 6.5% indicates diabetes; values between 5.7% and 6.4% may indicate an increased risk of developing diabetes.   ??? Cholesterol 06/02/2019 181  See Comment mg/dL Final    The significance of total cholesterol depends on the values of LDL, HDL, triglycerides and the clinical context. A patient-provider discussion may be considered.       ??? Cholesterol,LDL,Calc 06/02/2019 98  <100 mg/dL Final    If LDL value falls outside of the designated range AND if  included in any of the following categories, a  patient-provider discussion is recommended.     Statin therapy is recommended for individuals:  1. with clinical atherosclerotic cardiovascular disease     irrespective of LDL levels;  2. with LDL > or = 190 mg/dL;  3. with diabetes, aged 40-75 years, with LDL between 70 and     189 mg/dL;  4. without any of the above but who have LDL between 70 and     189 mg/dL and an estimated 45-WUJW risk of     atherosclerotic cardiovascular disease > or = 7.5%     (consider statin therapy if estimated 10-year risk > or =     5.0%) (ACC/AHA 2013 Guidelines).   ??? Cholesterol, HDL 06/02/2019 64  >50 mg/dL Final    If HDL cholesterol level falls outside of the designated  range, a patient-provider discussion is recommended   ??? Triglycerides 06/02/2019 95  <150 mg/dL Final    If Triglyceride level falls outside of the designated range,  a patient-provider discussion is recommended.     ??? Non-HDL,Chol,Calc 06/02/2019 117  <130 mg/dL Final    If Non-HDL cholesterol level falls outside of the designated  range, a patient-provider discussion is recommended.   ??? White Blood Cell Count 06/02/2019 7.15  4.16 - 9.95 x10E3/uL Final   ??? Red Blood Cell Count 06/02/2019 4.69  3.96 - 5.09 x10E6/uL Final   ??? Hemoglobin 06/02/2019 14.6  11.6 - 15.2 g/dL Final   ??? Hematocrit 06/02/2019 45.2  34.9 - 45.2 % Final   ??? Mean Corpuscular Volume 06/02/2019 96.4  79.3 - 98.6 fL Final   ??? Mean Corpuscular Hemoglobin 06/02/2019 31.1  26.4 - 33.4 pg Final   ??? MCH Concentration 06/02/2019 32.3  31.5 - 35.5 g/dL Final   ??? Red Cell Distribution Width-SD 06/02/2019 45.4  36.9 - 48.3 fL Final   ??? Red Cell Distribution Width-CV 06/02/2019 12.8  11.1 - 15.5 % Final   ??? Platelet Count, Auto 06/02/2019 373  143 - 398 x10E3/uL Final   ??? Mean Platelet Volume 06/02/2019 10.2  9.3 - 13.0 fL Final   ??? Nucleated RBC%, automated 06/02/2019 0.0  No Ref. Range % Final    Percent Reference Range Not Reported per accrediting agency   ??? Absolute Nucleated RBC Count 06/02/2019 0.00  0.00 - 0.00 x10E3/uL Final   ??? Neutrophil Abs (Prelim) 06/02/2019 4.02  See Absolute Neut Ct. x10E3/uL Final    This is  a preliminary result.  If automated differential see Absolute Neut  Count or if manual differential see Absolute Neut Ct, Manual for final result.   ??? Neutrophil Percent, Auto 06/02/2019 56.2  No Ref. Range % Final    Percent reference range not reported per accrediting agency.     ??? Lymphocyte Percent, Auto 06/02/2019 31.5  No Ref. Range % Final    Percent reference range not reported per accrediting agency.     ??? Monocyte Percent, Auto 06/02/2019 8.5  No Ref. Range % Final    Percent reference range not reported per accrediting agency.     ??? Eosinophil Percent, Auto 06/02/2019 2.5  No Ref. Range % Final    Percent reference range not reported per accrediting agency.     ??? Basophil Percent, Auto 06/02/2019 1.0  No Ref. Range % Final    Percent reference range not reported per accrediting agency.     ??? Immature Granulocytes% 06/02/2019 0.3  No Reference Range % Final    Percent reference range not reported per accrediting agency.     ??? Absolute Neut Count 06/02/2019 4.02  1.80 - 6.90 x10E3/uL Final   ??? Absolute Lymphocyte Count 06/02/2019 2.25  1.30 - 3.40 x10E3/uL Final   ??? Absolute Mono Count 06/02/2019 0.61  0.20 - 0.80 x10E3/uL Final   ??? Absolute Eos Count 06/02/2019 0.18  0.00 - 0.50 x10E3/uL Final   ??? Absolute Baso Count 06/02/2019 0.07  0.00 - 0.10 x10E3/uL Final   ??? Absolute Immature Gran Count 06/02/2019 0.02  0.00 - 0.04 x10E3/uL Final       Studies  Mammo Tomosynthesis, Screening, Bilat Breast    Result Date: 05/14/2019  Benign-appearing nodules in both breasts are benign.  Routine screening mammogram in 1 year is recommended.  Based on a modified IBIS risk calculator, this patient has an approximate 8% lifetime risk of developing breast cancer.  BI-RADS Category 2: Benign Finding(s)  Report electronically signed by: Samuel Germany, M.D.     Report electronically signed by: Samuel Germany  05/14/2019 at 04:55:05 PM  Technologist: Peyton Bottoms             Assessment/Plan:     Latacha is a 64 y.o. female patient with above medical problems who presents to clinic today to discuss, evaluate, and treat the following diagnoses:    Diagnoses and all orders for this visit:    Attention deficit hyperactivity disorder (ADHD), other type  -     amphetamine-dextroamphetamine 15 mg tablet; Take 1 tablet (15 mg total) by mouth three (3) times daily. Max Daily Amount: 45 mg  -     amphetamine-dextroamphetamine 15 mg tablet; Take 1 tablet (15 mg total) by mouth three (3) times daily. Max Daily Amount: 45 mg  -     amphetamine-dextroamphetamine 15 mg tablet; Take 1 tablet (15 mg total) by mouth three (3) times daily. Max Daily Amount: 45 mg  - CURES checked and consistent. Denies illicit drug use. Above Rx sent. Pt return to establish care with new pcp within [facility] several months prior to needing refills to ensure she has no lapses in Rx.     Other constipation  -     polyethylene glycol powder; Take 17 g by mouth daily as needed. To titrate for a BM every 2-3 days  -  Increase fiber and water intake  -  Follow up if sx persist after a month.     I spent a total of 30 minutes face  to face with the patient of which greater than 50% of that time was spent in counseling/coordination.  Topics of my discussion are in my note.        Future Appointments (if any - see below for last 10 appointments)  No future appointments.      The above plan of care, diagnosis, orders, and follow-up were discussed with the patient.  Questions related to this recommended plan of care were answered.    Fredderick Severance, MD  12/10/2019 at 9:47 AM

## 2020-03-31 ENCOUNTER — Telehealth: Payer: PRIVATE HEALTH INSURANCE

## 2020-03-31 NOTE — Telephone Encounter
Appointment Accommodation Request      Appointment Type: Provider Transfer    Reason for sooner request: Patient has run out of her medication Amphetamine-dextroamphetamine 15mg . Inquiring is she may be seen sooner for a refill.     Date/Time Requested (If any): as soon as possible.     Last seen by MD: 12/10/19- by Dr. 02/09/20    Any Symptoms:  []  Yes  [x]  No      ? If yes, what symptoms are you experiencing:   o Duration of symptoms (how long):     Patient or caller was offered an appointment but declined.    Patient or caller was advised to seek emergency services if conditions are urgent or emergent.    Patient has been notified of the 24-48 hour turnaround time.

## 2020-04-01 NOTE — Telephone Encounter
Referred to [facility].

## 2020-04-05 ENCOUNTER — Ambulatory Visit: Payer: PRIVATE HEALTH INSURANCE

## 2020-04-05 DIAGNOSIS — Z299 Encounter for prophylactic measures, unspecified: Secondary | ICD-10-CM

## 2020-04-05 DIAGNOSIS — F908 Attention-deficit hyperactivity disorder, other type: Secondary | ICD-10-CM

## 2020-04-05 DIAGNOSIS — M1711 Unilateral primary osteoarthritis, right knee: Secondary | ICD-10-CM

## 2020-04-05 MED ORDER — AMPHETAMINE-DEXTROAMPHETAMINE 15 MG PO TABS
15 mg | ORAL_TABLET | Freq: Three times a day (TID) | ORAL | 0 refills | Status: AC
Start: 2020-04-05 — End: ?

## 2020-04-05 NOTE — Patient Instructions
Return this week for knee cortisone injection  Labs 1 week before physical  Schedule physical ~11/23 or after    Mammogram due in November as well    Flu shot due

## 2020-04-05 NOTE — Progress Notes
PRIMARY CARE NOTE    PATIENT: Tiffany Richards  MRN: [id number]  DOB: [birth year]  DATE OF SERVICE: 04/05/2020  Chrissie Noa, MD    HPI   Tiffany Richards is a 64 y.o. female presents for   Chief Complaint   Patient presents with   ??? Establish Care       NEW PROBLEMS:    #hx of left knee replacement   R knee needs to be replaced - has had kenalog and synvisc. Had relief x 30M  ([facility] - [place])   increasing pain - medial compartment        CHRONIC PROBLEMS:  Hx of ADD on adderall - 15mg  TID  No ADR  Sleeping well, no cp/sob/dizziness/loss of appetite  Stable dose for many years  [x]  Stable/at goal  []  Not stable/at goal []  Exacerbated   []  Severe Exacerbation   HLD 20 mg atorvastatin  Lipid panel 05/2019 wnl  [x]  Stable/at goal  []  Not stable/at goal []  Exacerbated   []  Severe Exacerbation   preDM - A1c 5.9  [x]  Stable/at goal  []  Not stable/at goal []  Exacerbated []  Severe Exacerbation         PAST HISTORY     Patient Active Problem List    Diagnosis Date Noted   ??? Former smoker 09/24/2019   ??? Prediabetes 06/03/2019   ??? Hyperlipemia 04/28/2019   ??? Attention deficit disorder 04/28/2019       MEDS     Medications that the patient states to be currently taking   Medication Sig   ??? amphetamine-dextroamphetamine 15 mg tablet Take 1 tablet (15 mg total) by mouth three (3) times daily. Max Daily Amount: 45 mg   ??? ascorbic acid 500 mg tablet Take 500 mg by mouth.   ??? atorvastatin (LIPITOR) 20 mg tablet Take 1 tablet (20 mg total) by mouth at bedtime.   ??? Calcium Carbonate-Vitamin D 600-400 MG-UNIT per tablet Take 1 tablet by mouth.   ??? coenzyme Q10 200 mg capsule Take 200 mg by mouth.   ??? diclofenac 1% gel Apply topically four (4) times daily .   ??? Multiple Vitamin (THERA-TABS) TABS Take 1 tablet by mouth.   ??? polyethylene glycol powder Take 17 g by mouth daily as needed.   ??? vitamin E 400 unit capsule Take 400 Units by mouth.     PHYSICAL EXAM      Last Recorded Vital Signs:    04/05/20 0825   BP: 114/80   Pulse: 88 Resp: 17   Temp: 36.2 ???C (97.1 ???F)   SpO2: 99%      Body mass index is 26.61 kg/m???.    Physical Exam  Vitals signs and nursing note reviewed.   Constitutional:       Appearance: Normal appearance. She is not ill-appearing or diaphoretic.   Cardiovascular:      Rate and Rhythm: Normal rate and regular rhythm.      Heart sounds: No murmur.   Pulmonary:      Effort: Pulmonary effort is normal. No respiratory distress.      Breath sounds: Normal breath sounds.   Musculoskeletal:      Right knee: She exhibits swelling and effusion. Tenderness found. Medial joint line tenderness noted.   Neurological:      Mental Status: She is alert.   Psychiatric:         Mood and Affect: Mood normal.         Behavior: Behavior normal.  Thought Content: Thought content normal.         Judgment: Judgment normal.             REVIEW OF DATA   I have  []  Reviewed/ordered []  1 []  2 []  ? 3 unique laboratory, radiology, and/or diagnostic tests noted below   []  Reviewed []  1 []  2 []  ? 3 prior external notes and incorporated into patient assessment   []  Independently interpreted studies as noted   []  Discussed management or test interpretation with external provider(s) as noted     Last CBC:   Results for orders placed or performed in visit on 06/02/19   CBC   Result Value Ref Range    White Blood Cell Count 7.15 4.16 - 9.95 x10E3/uL    Red Blood Cell Count 4.69 3.96 - 5.09 x10E6/uL    Hemoglobin 14.6 11.6 - 15.2 g/dL    Hematocrit 62.1 30.8 - 45.2 %    Mean Corpuscular Volume 96.4 79.3 - 98.6 fL    Mean Corpuscular Hemoglobin 31.1 26.4 - 33.4 pg    MCH Concentration 32.3 31.5 - 35.5 g/dL    Red Cell Distribution Width-SD 45.4 36.9 - 48.3 fL    Red Cell Distribution Width-CV 12.8 11.1 - 15.5 %    Platelet Count, Auto 373 143 - 398 x10E3/uL    Mean Platelet Volume 10.2 9.3 - 13.0 fL    Nucleated RBC%, automated 0.0 No Ref. Range %    Absolute Nucleated RBC Count 0.00 0.00 - 0.00 x10E3/uL    Neutrophil Abs (Prelim) 4.02 See Absolute Neut Ct. x10E3/uL   Differential, Automated   Result Value Ref Range    Neutrophil Percent, Auto 56.2 No Ref. Range %    Lymphocyte Percent, Auto 31.5 No Ref. Range %    Monocyte Percent, Auto 8.5 No Ref. Range %    Eosinophil Percent, Auto 2.5 No Ref. Range %    Basophil Percent, Auto 1.0 No Ref. Range %    Immature Granulocytes% 0.3 No Reference Range %    Absolute Neut Count 4.02 1.80 - 6.90 x10E3/uL    Absolute Lymphocyte Count 2.25 1.30 - 3.40 x10E3/uL    Absolute Mono Count 0.61 0.20 - 0.80 x10E3/uL    Absolute Eos Count 0.18 0.00 - 0.50 x10E3/uL    Absolute Baso Count 0.07 0.00 - 0.10 x10E3/uL    Absolute Immature Gran Count 0.02 0.00 - 0.04 x10E3/uL   CBC & Auto Differential    Narrative    The following orders were created for panel order CBC & Auto Differential.  Procedure                               Abnormality         Status                     ---------                               -----------         ------                     MVH[[id number]]  Final result               Differential, Automated[[id number]]                          Final result                 Please view results for these tests on the individual orders.       Last CMP:   Results for orders placed or performed in visit on 06/02/19   Comprehensive Metabolic Panel   Result Value Ref Range    Sodium 144 135 - 146 mmol/L    Potassium 4.6 3.6 - 5.3 mmol/L    Chloride 106 96 - 106 mmol/L    Total CO2 27 20 - 30 mmol/L    Anion Gap 11 8 - 19 mmol/L    Glucose 100 (H) 65 - 99 mg/dL    GFR Estimate for Non-African American >89 See GFR Additional Information mL/min/1.32m2    GFR Estimate for African American >89 See GFR Additional Information mL/min/1.42m2    GFR Additional Information See Comment     Creatinine 0.67 0.60 - 1.30 mg/dL    Urea Nitrogen 13 7 - 22 mg/dL    Calcium 9.0 8.6 - 16.1 mg/dL    Total Protein 6.7 6.1 - 8.2 g/dL    Albumin 4.4 3.9 - 5.0 g/dL    Bilirubin,Total 0.4 0.1 - 1.2 mg/dL Alkaline Phosphatase 78 37 - 113 U/L    Aspartate Aminotransferase 23 13 - 47 U/L    Alanine Aminotransferase 23 8 - 64 U/L     Last Hgb A1C:   Lab Results   Component Value Date/Time    HGBA1C 5.9 (H) 06/02/2019 09:38 AM     Last Lipids:   Results for orders placed or performed in visit on 06/02/19   Lipid Panel   Result Value Ref Range    Cholesterol 181 See Comment mg/dL    Cholesterol,LDL,Calc 98 <100 mg/dL    Cholesterol, HDL 64 >50 mg/dL    Triglycerides 95 <096 mg/dL    Non-HDL,Chol,Calc 045 <130 mg/dL       ASSESSMENT & PLAN   Tiffany Richards is a 64 y.o. female presents for   Chief Complaint   Patient presents with   ??? Establish Care         ICD-10-CM    1. Attention deficit hyperactivity disorder (ADHD), other type  F90.8 amphetamine-dextroamphetamine 15 mg tablet  Stable dose  Cures checked  Refilled today   2. Preventive measure  Z29.9 Basic Metabolic Panel     Lipid Panel     CBC     Hgb A1c   3. Osteoarthritis of right knee, unspecified osteoarthritis type  M17.11 Will rtc for cortisone injection  Obtain records  May need repeat imaging if no relief   4. Prediabetes  R73.03 Hgb A1c     I attest that today I reviewed the CURES  (PDMP Prescription Drug Monitoring Program) website regarding this patient.      RISK OF COMPLICATION   This Clinical research associate has deemed the above diagnoses to have a risk of complication, morbidity or mortality of:   []  Minimal  []  Low  []  Moderate  []  Severe     SOCIAL DETERMINANTS OF HEALTH   []  The diagnosis or treatment of said conditions is significantly limited by social determinants of health       The above recommendation were discussed  with the patient. The patient has all questions answered satisfactorily and is in agreement with this recommended plan of care.    No follow-ups on file.      Author:  Chrissie Noa 04/05/2020 8:41 AM

## 2020-04-12 ENCOUNTER — Ambulatory Visit: Payer: PRIVATE HEALTH INSURANCE

## 2020-04-12 DIAGNOSIS — M1711 Unilateral primary osteoarthritis, right knee: Secondary | ICD-10-CM

## 2020-04-12 DIAGNOSIS — M25561 Pain in right knee: Secondary | ICD-10-CM

## 2020-04-12 DIAGNOSIS — G8929 Other chronic pain: Secondary | ICD-10-CM

## 2020-04-12 NOTE — Progress Notes
[facility]  04/12/2020    PATIENT: Tiffany Richards  MRN: [id number]  DOB: [birth year]  DATE OF SERVICE: 04/12/2020      PROCEDURE: right Cortisone Knee Injection     Informed Consent Discussion:     The risks, benefits, and alternatives to the injection were discussed in detail with the patient. The patient and/or patients surrogate voiced an understanding of the risks, potential benefits and limitations of the injection and wishes to proceed. The patient understands the risks to include and not be limited to: bleeding, infection, damage to nerves, arteries and other soft tissues, lightening of the skin, skin atrophy and thinning, continued pain, swelling, allergic reaction, failure of the procedure to relieve symptoms, worsening of symptoms, elevated blood glucose (if cortisone used in diabetic) and future arthritis.     Procedure:     Pre-Procedure Diagnosis: History of OA     Post Procedure Diagnosis: effusion. Knee OA    Procedure Performed By: Dr Sherryll Burger    Indications: Worsening effusion and pain    Location(s): right    Anesthesia: Local     Details of Procedure:   Kneeinj11: After informed consent was obtained, the right knee was prepped in a sterile fashion. Using a 22 gauge 1 1/2 inch needle, 1 milliliter of 40 milligrams/milliliter of triamcinolone and 2 milliliters of lidocaine 1% was injected into the knee joint using the medial approach.  The patient tolerated the procedure well.  There were no immediate complications.   Post injection instructions were given and the patient will follow up as indicated.      Complications: None     Impression/Plan:      Post injection instructions were given and the patient will follow as indicated.       Author: Chrissie Noa  04/12/2020  9:33 AM

## 2020-04-12 NOTE — Patient Instructions
Please call the office if you experience redness at the injection site, have increased pain or fevers.

## 2020-04-14 ENCOUNTER — Telehealth: Payer: PRIVATE HEALTH INSURANCE

## 2020-04-14 NOTE — Telephone Encounter
Called and left Vm for pt regarding her upcoming appt with Dr. Kathyrn Lass on 06/02/20.    Unfortunately, Dr. Kathyrn Lass will be out of the office that week are current appt will need to be re-sch.     For now, we have accommodated pt to the following date and time listed below.    Future Appointments    Jun 09, 2020 10:00 AM  PHYSICAL with Baldo Ash, MD  [facility] (MEDICINE) [address]  [address]  [place] [address]  [phone]        Requested for pt to please give [place] a call back if the new appt date and time work for her.     -TEV

## 2020-05-04 MED ORDER — AMPHETAMINE-DEXTROAMPHETAMINE 15 MG PO TABS
15 mg | ORAL_TABLET | Freq: Three times a day (TID) | ORAL | 0 refills
Start: 2020-05-04 — End: ?

## 2020-05-05 MED ORDER — AMPHETAMINE-DEXTROAMPHETAMINE 15 MG PO TABS
15 mg | ORAL_TABLET | Freq: Three times a day (TID) | ORAL | 0 refills | Status: AC
Start: 2020-05-05 — End: ?

## 2020-05-11 ENCOUNTER — Ambulatory Visit: Payer: PRIVATE HEALTH INSURANCE

## 2020-05-18 ENCOUNTER — Telehealth: Payer: PRIVATE HEALTH INSURANCE

## 2020-05-18 NOTE — Telephone Encounter
Left message for pt to return call regarding appt on 12/1. Informed pt MD is unavailable during the morning but is available that same day during the afternoon. Will need to reschedule appt to later in the afternoon that day or to another day

## 2020-05-19 NOTE — Telephone Encounter
Error

## 2020-05-28 ENCOUNTER — Inpatient Hospital Stay: Payer: PRIVATE HEALTH INSURANCE

## 2020-05-28 DIAGNOSIS — Z1231 Encounter for screening mammogram for malignant neoplasm of breast: Secondary | ICD-10-CM

## 2020-05-31 ENCOUNTER — Ambulatory Visit: Payer: PRIVATE HEALTH INSURANCE

## 2020-06-02 ENCOUNTER — Ambulatory Visit: Payer: PRIVATE HEALTH INSURANCE

## 2020-06-03 MED ORDER — AMPHETAMINE-DEXTROAMPHETAMINE 15 MG PO TABS
15 mg | ORAL_TABLET | Freq: Three times a day (TID) | ORAL | 0 refills | Status: AC
Start: 2020-06-03 — End: ?

## 2020-06-09 ENCOUNTER — Ambulatory Visit: Payer: PRIVATE HEALTH INSURANCE

## 2020-06-09 DIAGNOSIS — Z0000 Encounter for general adult medical examination without abnormal findings: Secondary | ICD-10-CM

## 2020-06-09 DIAGNOSIS — L732 Hidradenitis suppurativa: Secondary | ICD-10-CM

## 2020-06-09 DIAGNOSIS — Z634 Disappearance and death of family member: Secondary | ICD-10-CM

## 2020-06-09 DIAGNOSIS — F4321 Adjustment disorder with depressed mood: Secondary | ICD-10-CM

## 2020-06-09 MED ORDER — DOXYCYCLINE HYCLATE 100 MG PO CAPS
100 mg | ORAL_CAPSULE | Freq: Every day | ORAL | 1 refills | Status: AC
Start: 2020-06-09 — End: ?

## 2020-06-09 NOTE — Progress Notes
[facility] FAMILY MEDICINE CLINIC - WELL WOMAN EXAM    PATIENT: Tiffany Richards  MRN: [id number]  DOB: 1955-09-02  DATE OF SERVICE: 06/09/2020    PRIMARY CARE PROVIDER: Chrissie Noa, MD  REFERRING PROVIDER: No ref. provider found  Chief Complaint   Patient presents with   ??? Annual Exam      Patient Active Problem List    Diagnosis Date Noted   ??? Controlled substance agreement signed 04/12/2020     adderall 15mg . OV q4-38M ok     ??? Former smoker 09/24/2019   ??? Prediabetes 06/03/2019   ??? Hyperlipemia 04/28/2019   ??? Attention deficit disorder 04/28/2019     OV q4-38M ok. Stable dose. No ADR       Subjective:   Tiffany Richards is a 64 y.o. female here for preventive exam.    #Preventive medicine:  Diet:  Adequate fruits and vegetables [x]  yes   []  no  Exercise:  At least 150 min per week? [x]  yes   []  no  At least 30 min daily  Screenings:  Mood:   Depression Screening (Patient Health Questionnaire PHQ) 04/28/2019 06/09/2020   PHQ-2: Feeling down, depressed, or hopeless Yes -   Little interest or pleasure in doing things Several days Not at all   Feeling down, depressed, or hopeless More than half the days Not at all   Trouble falling or staying asleep, or sleeping too much Not at all -   Feeling tired or having little energy More than half the days -   Poor appetite or overeating Not at all -   Feeling bad about yourself - or that you are a failure or have let yourself or your family down Several days -   Trouble concentrating on things, such as reading the newspaper or watching television Nearly every day -   Moving or speaking so slowly Or being so fidgety or restless More than half the days -   Thoughts that you would be better off dead, or of hurting yourself in some way Not at all -   Total Score 11 0     Breast cancer: family history: +mother, mammo UTD, normal  Cervical cancer: Pap/cotesting NA, s/p hysterectomy  Colon cancer: family history none, colonoscopy UTD    #Groin abscesses  Gets them all the time, has had since young  Hundreds over the years  Tried oral tetracyclines when living in [place], was stopped by MD in [place]  Worked well when taking regularly  Would like to start again  No current lesions, no pus or drainage but +scarring  Never lesions in axilla      Past Medical History:   Diagnosis Date   ??? Anxiety    ??? Arthritis    ??? Hyperlipidemia    ,   Past Surgical History:   Procedure Laterality Date   ??? CESAREAN SECTION  08/27/1991   ??? JOINT REPLACEMENT  10/2014    L knee   ??? TUBAL LIGATION  08/27/1991   ,   Family History   Problem Relation Age of Onset   ??? Breast cancer Mother         late 9s   ??? CABG Father    ??? Epilepsy Brother    ??? Colon cancer Neg Hx    ??? Diabetes Neg Hx    ,   Social History     Socioeconomic History   ??? Marital status: Married     Spouse name: Not on  file   ??? Number of children: Not on file   ??? Years of education: Not on file   ??? Highest education level: Not on file   Occupational History   ??? Not on file   Tobacco Use   ??? Smoking status: Former Smoker     Packs/day: 0.00     Years: 0.00     Pack years: 0.00     Quit date: 2016     Years since quitting: 5.9   ??? Smokeless tobacco: Never Used   ??? Tobacco comment: 15 pack years   Substance and Sexual Activity   ??? Alcohol use: Not Currently     Comment: 4 beers per week   ??? Drug use: Never   ??? Sexual activity: Yes     Partners: Male     Birth control/protection: Female Sterilization     Comment: monogamous   Other Topics Concern   ??? Not on file   Social History Narrative    06/09/2020    Has 3 kids, from [place] then moved to [place] for 12 years, then moved to [place] to live with mom and help her for a few years.  Moved here for husband's job (working on project at [employer]), about 18 months ago.  Kids all grown, do not live here.  Oldest son passed away ~6 years ago from CO poisoning.  Still dealing with the grief, is Catholic but not very religious.  No therapy at this time.     Social Determinants of Health     Physical Activity: Not on file Stress: Not on file   Financial Resource Strain: Not on file       Allergies   Allergen Reactions   ??? Sulfa Antibiotics Hives and Rash     Other reaction(s): Rash/Hives/Urticaria  And rash all over  And rash all over  And rash all over   ??? Morphine Hives, Itching and Rash   ??? Hydrocodone Itching and Rash      Outpatient Medications Marked as Taking for the 06/09/20 encounter (Office Visit) with Baldo Ash, MD   Medication   ??? amphetamine-dextroamphetamine 15 mg tablet   ??? atorvastatin (LIPITOR) 20 mg tablet       Review of Systems:   14 pt ROS reviewed and is negative except per HPI    Objective:   Physical Exam:  BP 132/88  ~ Pulse 96  ~ Temp 36.2 ???C (97.2 ???F) (Forehead)  ~ Ht 5' 4'' (1.626 m)  ~ Wt 152 lb 6.4 oz (69.1 kg)  ~ SpO2 97%  ~ BMI 26.16 kg/m???   General: well-appearing, no distress  HEENT: NC/AT, EOMI, PERRL, anicteric sclera, MMM, no oral lesions noted, TMs clear b/l  Neck: symmetric, no thyromegaly, no LAD  CV: RRR, no murmurs, rubs, gallops, distal pulses intact b/l  Pulm: CTAB, normal effort, good air movement  Breast: deferred  Abd: soft, NT/ND, +BS  GU: deferred  Extremities: normal strength and tone, no LE edema   Skin: warm and dry, no rashes.  +nodules, bridged scars along the inguinal region, no active erythematous lesions, no discharge.   Neuro: non-focal, sensation and motor grossly intact    Lab/Imaging Review:    I have:    [x]  Reviewed/ordered  []  1  []  2  [x]  ? 3 unique laboratory, radiology, and/or diagnostic tests noted below.   []  Reviewed  []  1  []  2  []  ? 3 prior external notes and incorporated into patient assessment.   []   Discussed management or test interpretation with external provider(s) as noted.   []  Obtained history from someone other than patient   []  Decided to get outside medical records     []  Independently interpretated studies    LabStudies:  Last CBC:   Results for orders placed or performed in visit on 06/02/19   CBC   Result Value Ref Range    White Blood Cell Count 7.15 4.16 - 9.95 x10E3/uL    Red Blood Cell Count 4.69 3.96 - 5.09 x10E6/uL    Hemoglobin 14.6 11.6 - 15.2 g/dL    Hematocrit 45.4 09.8 - 45.2 %    Mean Corpuscular Volume 96.4 79.3 - 98.6 fL    Mean Corpuscular Hemoglobin 31.1 26.4 - 33.4 pg    MCH Concentration 32.3 31.5 - 35.5 g/dL    Red Cell Distribution Width-SD 45.4 36.9 - 48.3 fL    Red Cell Distribution Width-CV 12.8 11.1 - 15.5 %    Platelet Count, Auto 373 143 - 398 x10E3/uL    Mean Platelet Volume 10.2 9.3 - 13.0 fL    Nucleated RBC%, automated 0.0 No Ref. Range %    Absolute Nucleated RBC Count 0.00 0.00 - 0.00 x10E3/uL    Neutrophil Abs (Prelim) 4.02 See Absolute Neut Ct. x10E3/uL   Differential, Automated   Result Value Ref Range    Neutrophil Percent, Auto 56.2 No Ref. Range %    Lymphocyte Percent, Auto 31.5 No Ref. Range %    Monocyte Percent, Auto 8.5 No Ref. Range %    Eosinophil Percent, Auto 2.5 No Ref. Range %    Basophil Percent, Auto 1.0 No Ref. Range %    Immature Granulocytes% 0.3 No Reference Range %    Absolute Neut Count 4.02 1.80 - 6.90 x10E3/uL    Absolute Lymphocyte Count 2.25 1.30 - 3.40 x10E3/uL    Absolute Mono Count 0.61 0.20 - 0.80 x10E3/uL    Absolute Eos Count 0.18 0.00 - 0.50 x10E3/uL    Absolute Baso Count 0.07 0.00 - 0.10 x10E3/uL    Absolute Immature Gran Count 0.02 0.00 - 0.04 x10E3/uL   CBC & Auto Differential    Narrative    The following orders were created for panel order CBC & Auto Differential.  Procedure                               Abnormality         Status                     ---------                               -----------         ------                     JXB[[id number]]                                              Final result               Differential, Automated[[id number]]                          Final  result                 Please view results for these tests on the individual orders.     Last CMP:   Results for orders placed or performed in visit on 06/02/19   Comprehensive Metabolic Panel Result Value Ref Range    Sodium 144 135 - 146 mmol/L    Potassium 4.6 3.6 - 5.3 mmol/L    Chloride 106 96 - 106 mmol/L    Total CO2 27 20 - 30 mmol/L    Anion Gap 11 8 - 19 mmol/L    Glucose 100 (H) 65 - 99 mg/dL    GFR Estimate for Non-African American >89 See GFR Additional Information mL/min/1.13m2    GFR Estimate for African American >89 See GFR Additional Information mL/min/1.76m2    GFR Additional Information See Comment     Creatinine 0.67 0.60 - 1.30 mg/dL    Urea Nitrogen 13 7 - 22 mg/dL    Calcium 9.0 8.6 - 45.4 mg/dL    Total Protein 6.7 6.1 - 8.2 g/dL    Albumin 4.4 3.9 - 5.0 g/dL    Bilirubin,Total 0.4 0.1 - 1.2 mg/dL    Alkaline Phosphatase 78 37 - 113 U/L    Aspartate Aminotransferase 23 13 - 47 U/L    Alanine Aminotransferase 23 8 - 64 U/L     Last Hgb A1C:   Lab Results   Component Value Date/Time    HGBA1C 6.0 (H) 05/31/2020 08:39 AM     Last Lipids:   Results for orders placed or performed in visit on 05/31/20   Lipid Panel   Result Value Ref Range    Cholesterol 175 See Comment mg/dL    Cholesterol,LDL,Calc 94 <100 mg/dL    Cholesterol, HDL 66 >50 mg/dL    Triglycerides 76 <098 mg/dL    Non-HDL,Chol,Calc 119 <130 mg/dL       Imaging Studies:  Mammo tomosynthesis, screening, bilat breast    Status: Final result       Study Result    Narrative & Impression   ???  [facility] HEALTH RADIOLOGY REPORT  [facility]  [address]  [place], [place]  [address]     Patient: Saylah, Ketner  DOB: February 02, 1956 Age: 25 - female  MRN: [id number]  Referring Physician: Chrissie Noa  ___________________________________________________________________________     Exam Date: 05/28/2020     BILATERAL SCREENING DIGITAL BREAST TOMOSYNTHESIS     HISTORY:  Patient is 64 years old and is seen for screening.     FILMS COMPARED:  The present examination has been compared to prior imaging studies.     TECHNIQUE:  The following 3D digital breast tomosynthesis with 2D composite views were obtained: bilateral  craniocaudal with tomosynthesis and mediolateral oblique with tomosynthesis.     Computer-aided detection was utilized by the radiologist in the interpretation of this examination.     MAMMOGRAM FINDINGS:     There are scattered areas of fibroglandular densities (density B).     No suspicious masses, calcifications or other abnormalities are seen in either breast.  There are no  significant changes from the prior study.     IMPRESSION:     ???  There is no mammographic evidence of malignancy.     Routine screening mammogram in 1 year is recommended.     Based on a modified Tyrer-Cuzick version 8 risk calculator, this patient has an approximate 7.2%  lifetime risk of developing breast cancer. A risk score over 20% is considered high risk.     BI-RADS Category 1:  Negative        Report electronically signed by: Myrna Blazer, M.D., have reviewed and edited this report and I concur with the final assessment.     Report electronically signed by: Gayland Curry  05/31/2020 at 10:29:42 AM     Technologist: Wyline Mood          Provider Note Review:       Assessment & Plan:   Diagnoses and all orders for this visit:    Encounter for annual health examination    Hidradenitis suppurativa: of the groin, ~Hurley stage II, per pt tried oral tetracyclines in the past with good result, would like to restart.  discussed R/B/A of medications, as well as side effects and expectations.  Discussed return precautions, pt agreeable with plan.  -     doxycycline 100 mg capsule; Take 1 capsule (100 mg total) by mouth daily.    Grief at loss of child: discussed grief groups, has good days and harder days, no therapy now but will readdress at next visit.    #Preventive medicine:  Body mass index is 26.16 kg/m???.  Exercise/diet: discussed healthy lifestyle  PHQ2 or PHQ9 (if applicable): neg  Smoking cessation: N/A  EtOH: N/A  Lung CA screen: N/A  Breast cancer: mammo UTD  Cervical cancer: Pap/cotesting NA  Colon cancer: colo UTD  STI screen: low risk  BP: Stage 1  Lipid panel: discussed  DM screen: discussed  DEXA: N/A  Vaccines: per Care Gaps    Return in about 6 weeks (around 07/21/2020).    Author:  Baldo Ash, MD 06/09/2020 3:06 PM

## 2020-07-06 MED ORDER — AMPHETAMINE-DEXTROAMPHETAMINE 15 MG PO TABS
15 mg | ORAL_TABLET | Freq: Three times a day (TID) | ORAL | 0 refills | Status: AC
Start: 2020-07-06 — End: ?

## 2020-08-04 ENCOUNTER — Ambulatory Visit: Payer: PRIVATE HEALTH INSURANCE

## 2020-08-04 DIAGNOSIS — E7849 Other hyperlipidemia: Secondary | ICD-10-CM

## 2020-08-04 MED ORDER — AMPHETAMINE-DEXTROAMPHETAMINE 15 MG PO TABS
15 mg | ORAL_TABLET | Freq: Three times a day (TID) | ORAL | 0 refills | Status: AC
Start: 2020-08-04 — End: ?

## 2020-08-05 MED ORDER — ATORVASTATIN CALCIUM 20 MG PO TABS
20 mg | ORAL_TABLET | Freq: Every evening | ORAL | 1 refills | Status: AC
Start: 2020-08-05 — End: ?

## 2020-09-06 MED ORDER — AMPHETAMINE-DEXTROAMPHETAMINE 15 MG PO TABS
15 mg | ORAL_TABLET | Freq: Three times a day (TID) | ORAL | 0 refills | Status: AC
Start: 2020-09-06 — End: ?

## 2020-10-03 MED ORDER — AMPHETAMINE-DEXTROAMPHETAMINE 15 MG PO TABS
15 mg | ORAL_TABLET | Freq: Three times a day (TID) | ORAL | 0 refills
Start: 2020-10-03 — End: ?

## 2020-10-03 MED ORDER — DOXYCYCLINE HYCLATE 100 MG PO CAPS
100 mg | ORAL_CAPSULE | Freq: Every day | ORAL | 1 refills
Start: 2020-10-03 — End: ?

## 2020-10-06 MED ORDER — AMPHETAMINE-DEXTROAMPHETAMINE 15 MG PO TABS
15 mg | ORAL_TABLET | Freq: Three times a day (TID) | ORAL | 0 refills | Status: AC
Start: 2020-10-06 — End: ?

## 2020-10-06 MED ORDER — DOXYCYCLINE HYCLATE 100 MG PO CAPS
100 mg | ORAL_CAPSULE | Freq: Every day | ORAL | 0 refills | Status: AC
Start: 2020-10-06 — End: ?

## 2020-10-25 ENCOUNTER — Ambulatory Visit: Payer: PRIVATE HEALTH INSURANCE

## 2020-10-29 ENCOUNTER — Ambulatory Visit: Payer: PRIVATE HEALTH INSURANCE

## 2020-10-29 DIAGNOSIS — E7849 Other hyperlipidemia: Secondary | ICD-10-CM

## 2020-10-29 DIAGNOSIS — M1711 Unilateral primary osteoarthritis, right knee: Secondary | ICD-10-CM

## 2020-10-29 MED ADMIN — TRIAMCINOLONE ACETONIDE 40 MG/ML IJ SUSP: 40 mg | INTRA_ARTICULAR | @ 19:00:00 | Stop: 2020-10-29

## 2020-10-29 NOTE — Patient Instructions
Please call the office if you experience redness at the injection site, have increased pain or fevers.

## 2020-10-29 NOTE — Progress Notes
Internal Medicine Outpatient Progress Note    Patient: Tiffany Richards  MRN: [id number]  Primary Care Physician: Chrissie Noa, MD  Date of Service: 10/29/2020    Chief Complaint   Patient presents with   ??? cortisol injection     for right knee        History:   History of Present Illness/Interval History:  Tiffany Richards is a 65 y.o. female with PMH of osteoarthritis who presents for follow up care:    Interval Events:  States she is here today for a cortisone shot in her R knee. She has received cortisone injections before in this knee. She also had durolane injection 07/21/20 that did not help her. On 07/05/18 she has synvisc-one injection that worked for 14-15 months. Her last cortisone shot was about 6-7 months ago here. In total, she has received about 10 cortisone shots in bilateral knees for her osteoarthritis--these shots help control her pain for 3-4 months. In 2016, she had her left knee replaced.     States her right medial knee is very painful when she is walking down stairs and general walking. Reports swollen knee and intermittent radiating pain that goes down her leg. She states she has been avoiding activity d/t pain.  Occasionally, pain occurs while at rest.  She states heat/ice has helped her pain as well ibuprofen and diclofenac gel have also helped w/pain. Denies recent trauma to knee and states this pain feels similar to the episodes she has experienced in the past. Denies constitutional symptoms.     Pending possible R Total knee - has Ortho back home who she will see in June/July    Past Medical History:  Past Medical History:   Diagnosis Date   ??? Anxiety    ??? Arthritis    ??? Hyperlipidemia        Reconciled medication list:  Medications that the patient states to be currently taking   Medication Sig   ??? amphetamine-dextroamphetamine 15 mg tablet Take 1 tablet (15 mg total) by mouth three (3) times daily. Max Daily Amount: 45 mg   ??? ascorbic acid 500 mg tablet Take 500 mg by mouth .   ??? atorvastatin 20 mg tablet Take 1 tablet (20 mg total) by mouth at bedtime.   ??? Calcium Carbonate-Vitamin D 600-400 MG-UNIT per tablet Take 1 tablet by mouth .   ??? coenzyme Q10 200 mg capsule Take 200 mg by mouth .   ??? diclofenac 1% gel Apply topically four (4) times daily .   ??? doxycycline 100 mg capsule Take 1 capsule (100 mg total) by mouth daily.   ??? Multiple Vitamin (THERA-TABS) TABS Take 1 tablet by mouth .   ??? vitamin E 400 unit capsule Take 400 Units by mouth .     Current Facility-Administered Medications for the 10/29/20 encounter (Office Visit) with Chrissie Noa, MD   Medication   ??? lidocaine PF 1% inj 2 mL   ??? triamcinolone acetonide 40 mg/mL inj 40 mg       Allergies:  Allergies   Allergen Reactions   ??? Sulfa Antibiotics Hives and Rash     Other reaction(s): Rash/Hives/Urticaria  And rash all over  And rash all over  And rash all over   ??? Morphine Hives, Itching and Rash   ??? Hydrocodone Itching and Rash       Additional PMH/FH/SH:  Tobacco:  reports that she quit smoking about 6 years ago. She smoked 0.00 packs per day for 0.00  years. She has never used smokeless tobacco.  family history includes Breast cancer in her mother; CABG in her father; Epilepsy in her brother.   reports that she quit smoking about 6 years ago. She smoked 0.00 packs per day for 0.00 years. She has never used smokeless tobacco. She reports previous alcohol use. She reports that she does not use drugs.      Review of Systems:   (2-9 ROS for level 3-4 visit)    Review of Systems   Constitutional: Negative for chills and fever.   Eyes: Negative for blurred vision and double vision.   Genitourinary: Negative for dysuria.   Musculoskeletal: Positive for joint pain. Negative for falls.   Skin: Negative.    Endo/Heme/Allergies: Negative for polydipsia.       Health Maintenance:    Health Maintenance   Topic Date Due   ??? Hepatitis B Screening  Never done   ??? Annual Preventive Wellness Visit  06/09/2021   ??? Colorectal Cancer Screening  07/10/2021   ??? Breast Ca Screening: MAMMOGRAM  05/28/2022   ??? Tdap/Td Vaccine (2 - Td or Tdap) 06/12/2024   ??? Influenza Vaccine  Completed   ??? Hepatitis C Screening  Completed   ??? Shingles (Shingrix) Vaccine  Completed   ??? COVID-19 Vaccine(Tracks primary and booster doses, not sup/immunocomp)  Completed   ??? HIV Screening  Completed   ??? Cervical Ca Screening: HPV Testing  Discontinued   ??? Cervical Ca Screening: PAP Smear  Discontinued       Immunization History   Administered Date(s) Administered   ??? COVID-19, mRNA, (Moderna) 100 mcg/0.5 mL 10/05/2019, 11/02/2019, 05/04/2020   ??? Influenza vaccine IM quadrivalent (Afluria Quad) (PF) SYR (78 years of age and older) 05/02/2014, 05/13/2017   ??? Tdap 06/12/2014   ??? influenza vaccine IM cell culture quadrivalent (Flucelvax Quad) MDV (38 years of age and older) 05/28/2018   ??? influenza vaccine IM quadrivalent (Fluarix Quad) (PF) SYR (26 months of age and older) 04/14/2019   ??? influenza vaccine IM quadrivalent (Fluzone Quad) MDV (51 months of age and older) 06/09/2017   ??? influenza vaccine IM trivalent (Fluvirin) MDV (54 years of age and older) 04/23/2013, 05/01/2015, 05/13/2017   ??? influenza, unspecified formulation 04/14/2019, 04/14/2019, 04/19/2020   ??? zoster vac recomb adjuvanted (Shingrix) 06/24/2019, 08/28/2019         Physical Exam:   Vitals:   BP 122/80  ~ Pulse (!) 103  ~ Temp 36.1 ???C (96.9 ???F) (Forehead)  ~ Resp 17  ~ Ht 5' 4'' (1.626 m)  ~ Wt 148 lb (67.1 kg)  ~ SpO2 99%  ~ BMI 25.40 kg/m???     Wt Readings from Last 3 Encounters:   10/29/20 148 lb (67.1 kg)   06/09/20 152 lb 6.4 oz (69.1 kg)   04/12/20 155 lb 12.8 oz (70.7 kg)       System Check if Normal Positive or additional negative findings   Constit  [x]  General appearance     Eyes  []  Conj/Lids []  Pupils  []  Fundi     ENMT  []  External ears/nose []  Otoscopy   []  Hearing []  Nasal mucosa   []  Lips/teeth/gums []  Oropharynx     Neck  []  Inspection/palpation []  Thyroid     Resp  []  Normal effort []  No Wheezing    []  Auscultation  [] No Crackles     CV  []  RRR   []  No  Murmurs  []  No Edema   []  JVP  non-elevated    Normal pulses:   []  Abd aorta []  Femoral  []  Pedal     Breast  []  Inspection []  Palpation     GI  []  No abd masses    []  No tenderness   []  Liver/spleen []  Rectal     GU  M: []  Scrotum []  Penis []  Prostate   F:  []  External []  Bladder []  Cervix         []  Uterus    []  Adnexa      Lymph  []  Neck []  Axillae []  Groin     MS Specify site examined:    [x]  Inspect/palp [x]  ROM   []  Stability []  Strength/tone  R knee medial effusion  No erythema  Limited ROM, tenderess medial joint line   Skin  []  Inspection []  Palpation     Neuro  []  CN2-12 intact    []  Oriented x 3   []  Patellar DTR  []  Brachial DTR       []  Sensation   []  Normal Gait     Psych  []  Insight/judgement     []  Mood/affect          Data:   Lab Review:   Lab Results   Component Value Date    WBC 6.72 05/31/2020    HGB 14.6 05/31/2020    HCT 45.4 (H) 05/31/2020    MCV 98.1 05/31/2020    PLT 379 05/31/2020     Lab Results   Component Value Date    CREAT 0.70 05/31/2020    BUN 13 05/31/2020    NA 141 05/31/2020    K 4.2 05/31/2020    CL 106 05/31/2020    CO2 23 05/31/2020     Lab Results   Component Value Date    ALT 23 06/02/2019    AST 23 06/02/2019    ALKPHOS 78 06/02/2019    BILITOT 0.4 06/02/2019     Lab Results   Component Value Date    CHOL 175 05/31/2020    CHOLDLCAL 94 05/31/2020    TRIGLY 76 05/31/2020     No results found for: TSH  Lab Results   Component Value Date    HGBA1C 6.0 (H) 05/31/2020       Studies/Imaging:    Random glucose 106 today    I have:   [x]  Reviewed/ordered 1 2 ? 3 unique laboratory, radiology, and/or diagnostic tests noted below    []  Reviewed 1 2 ? 3 prior external notes and incorporated into patient assessment    []  Discussed management or test interpretation with external provider(s) as noted      Assessment/Plan:   Quierra Silverio is a 65 y.o. female who presents for management of the following medical conditions:    There are no diagnoses linked to this encounter.     1. Primary osteoarthritis of right knee    2. Other hyperlipidemia    3. Prediabetes        PLAN    Orders Placed This Encounter   ??? Comprehensive Metabolic Panel   ??? Lipid Panel   ??? CBC   ??? Hgb A1c   ??? POCT glucose   ??? [20610] DRAIN/INJECT LARGE JOINT/BURSA   ??? lidocaine PF 1% inj 2 mL   ??? triamcinolone acetonide 40 mg/mL inj 40 mg       R/b/a discussed of knee cortisone injection  Consent obtained  Anesthesia obtained with benzocaine spray  Area cleaned with betadine and alcohol  wipe  Mixture of 1cc 1% lidocaine without epi and 1 cc 40mg  kenalog injected lateral approach  Pt tolerated procedure well  Reports acute improvement in pain and range of motion  Acute blood loss: 0 cc    To monitor for signs of infection/bleeding          Lab Results  (Last 360 days)      05/31/20 0839    Result             6.0                  Follow-up: 52M    The above plan of care, diagnosis, orders, and follow-up were discussed with the patient.  Questions related to this recommended plan of care were answered.  See AVS for additional information and counseling materials provided to the patient.    Discussed with attending, Dr. Chrissie Noa.    Joselyn Arrow, MS3  10/29/2020 10:31 AM    This note was authored by medical student, Joselyn Arrow, at [facility]. This note should not be consulted for clinical, billing, or medico-legal purposes unless a physician has reviewed this note and added an attestation below regarding the accuracy of above documentation.  --------------------------------    I was physically present with the medical student and I performed all key elements of history, exam, and medical decision making in the care of Isidor Holts and I agree with plan above. I have verified the documentation above as accurate with my edits and additions    Chrissie Noa, MD

## 2020-11-04 MED ORDER — AMPHETAMINE-DEXTROAMPHETAMINE 15 MG PO TABS
15 mg | ORAL_TABLET | Freq: Three times a day (TID) | ORAL | 0 refills | Status: AC
Start: 2020-11-04 — End: ?

## 2020-12-01 MED ORDER — AMPHETAMINE-DEXTROAMPHETAMINE 15 MG PO TABS
15 mg | ORAL_TABLET | Freq: Three times a day (TID) | ORAL | 0 refills
Start: 2020-12-01 — End: ?

## 2020-12-02 MED ORDER — AMPHETAMINE-DEXTROAMPHETAMINE 15 MG PO TABS
15 mg | ORAL_TABLET | Freq: Three times a day (TID) | ORAL | 0 refills | Status: AC
Start: 2020-12-02 — End: ?

## 2020-12-03 ENCOUNTER — Ambulatory Visit: Payer: PRIVATE HEALTH INSURANCE

## 2020-12-12 ENCOUNTER — Institutional Professional Consult (permissible substitution): Payer: PRIVATE HEALTH INSURANCE

## 2020-12-12 DIAGNOSIS — R7303 Prediabetes: Secondary | ICD-10-CM

## 2020-12-12 DIAGNOSIS — E7849 Other hyperlipidemia: Secondary | ICD-10-CM

## 2020-12-13 LAB — Comprehensive Metabolic Panel
CALCIUM: 9.5 mg/dL (ref 8.6–10.4)
SODIUM: 141 mmol/L (ref 135–146)
UREA NITROGEN: 15 mg/dL (ref 7–22)

## 2020-12-13 LAB — CBC: HEMOGLOBIN: 14.6 g/dL (ref 11.6–15.2)

## 2020-12-13 LAB — Lipid Panel: CHOLESTEROL, HDL: 64 mg/dL (ref >50–<130)

## 2020-12-13 LAB — Hgb A1c: HGB A1C - HPLC: 5.9 — ABNORMAL HIGH (ref <5.7)

## 2020-12-31 MED ORDER — DOXYCYCLINE HYCLATE 100 MG PO CAPS
100 mg | ORAL_CAPSULE | Freq: Every day | ORAL | 0 refills
Start: 2020-12-31 — End: ?

## 2021-01-01 MED ORDER — AMPHETAMINE-DEXTROAMPHETAMINE 15 MG PO TABS
15 mg | ORAL_TABLET | Freq: Three times a day (TID) | ORAL | 0 refills | Status: AC
Start: 2021-01-01 — End: ?

## 2021-01-02 MED ORDER — DOXYCYCLINE HYCLATE 100 MG PO CAPS
100 mg | ORAL_CAPSULE | Freq: Every day | ORAL | 0 refills | Status: AC
Start: 2021-01-02 — End: ?

## 2021-01-26 MED ORDER — ATORVASTATIN CALCIUM 20 MG PO TABS
ORAL_TABLET | 0 refills | Status: AC
Start: 2021-01-26 — End: ?

## 2021-01-31 MED ORDER — AMPHETAMINE-DEXTROAMPHETAMINE 15 MG PO TABS
15 mg | ORAL_TABLET | Freq: Three times a day (TID) | ORAL | 0 refills | Status: AC
Start: 2021-01-31 — End: ?

## 2021-03-03 MED ORDER — AMPHETAMINE-DEXTROAMPHETAMINE 15 MG PO TABS
15 mg | ORAL_TABLET | Freq: Three times a day (TID) | ORAL | 0 refills | Status: AC
Start: 2021-03-03 — End: ?

## 2021-04-01 MED ORDER — AMPHETAMINE-DEXTROAMPHETAMINE 15 MG PO TABS
15 mg | ORAL_TABLET | Freq: Three times a day (TID) | ORAL | 0 refills
Start: 2021-04-01 — End: ?

## 2021-04-04 ENCOUNTER — Ambulatory Visit: Payer: PRIVATE HEALTH INSURANCE

## 2021-04-04 MED ORDER — AMPHETAMINE-DEXTROAMPHETAMINE 15 MG PO TABS
15 mg | ORAL_TABLET | Freq: Three times a day (TID) | ORAL | 0 refills | Status: AC
Start: 2021-04-04 — End: ?

## 2021-04-14 ENCOUNTER — Ambulatory Visit: Payer: PRIVATE HEALTH INSURANCE

## 2021-04-28 MED ORDER — ATORVASTATIN CALCIUM 20 MG PO TABS
ORAL_TABLET | 0 refills
Start: 2021-04-28 — End: ?

## 2021-04-29 MED ORDER — ATORVASTATIN CALCIUM 20 MG PO TABS
ORAL_TABLET | 0 refills
Start: 2021-04-29 — End: ?

## 2021-04-30 MED ORDER — ATORVASTATIN CALCIUM 20 MG PO TABS
ORAL_TABLET | 0 refills | Status: AC
Start: 2021-04-30 — End: ?

## 2021-05-02 MED ORDER — AMPHETAMINE-DEXTROAMPHETAMINE 15 MG PO TABS
15 mg | ORAL_TABLET | Freq: Three times a day (TID) | ORAL | 0 refills
Start: 2021-05-02 — End: ?

## 2021-05-03 MED ORDER — AMPHETAMINE-DEXTROAMPHETAMINE 15 MG PO TABS
15 mg | ORAL_TABLET | Freq: Three times a day (TID) | ORAL | 0 refills | Status: AC
Start: 2021-05-03 — End: ?

## 2021-05-10 ENCOUNTER — Ambulatory Visit: Payer: PRIVATE HEALTH INSURANCE

## 2021-05-23 ENCOUNTER — Ambulatory Visit: Payer: PRIVATE HEALTH INSURANCE

## 2021-05-27 ENCOUNTER — Ambulatory Visit: Payer: PRIVATE HEALTH INSURANCE

## 2021-06-02 MED ORDER — AMPHETAMINE-DEXTROAMPHETAMINE 15 MG PO TABS
15 mg | ORAL_TABLET | Freq: Three times a day (TID) | ORAL | 0 refills
Start: 2021-06-02 — End: ?

## 2021-06-08 MED ORDER — AMPHETAMINE-DEXTROAMPHETAMINE 15 MG PO TABS
15 mg | ORAL_TABLET | Freq: Three times a day (TID) | ORAL | 0 refills | Status: AC
Start: 2021-06-08 — End: 2021-06-17

## 2021-06-09 ENCOUNTER — Ambulatory Visit: Payer: PRIVATE HEALTH INSURANCE

## 2021-06-10 ENCOUNTER — Ambulatory Visit: Payer: PRIVATE HEALTH INSURANCE

## 2021-06-13 MED ORDER — AMPHETAMINE-DEXTROAMPHETAMINE 15 MG PO TABS
15 mg | ORAL_TABLET | Freq: Three times a day (TID) | ORAL | 0 refills
Start: 2021-06-13 — End: ?

## 2021-06-16 MED ORDER — AMPHETAMINE-DEXTROAMPHETAMINE 15 MG PO TABS
15 mg | ORAL_TABLET | Freq: Three times a day (TID) | ORAL | 0 refills | Status: AC
Start: 2021-06-16 — End: ?

## 2021-07-15 MED ORDER — AMPHETAMINE-DEXTROAMPHETAMINE 15 MG PO TABS
15 mg | ORAL_TABLET | Freq: Three times a day (TID) | ORAL | 0 refills | Status: AC
Start: 2021-07-15 — End: ?

## 2021-07-22 ENCOUNTER — Inpatient Hospital Stay: Payer: PRIVATE HEALTH INSURANCE

## 2021-07-22 DIAGNOSIS — Z1239 Encounter for other screening for malignant neoplasm of breast: Secondary | ICD-10-CM

## 2021-07-26 ENCOUNTER — Ambulatory Visit: Payer: PRIVATE HEALTH INSURANCE

## 2021-07-26 ENCOUNTER — Non-Acute Institutional Stay: Payer: BLUE CROSS/BLUE SHIELD

## 2021-07-26 DIAGNOSIS — E7849 Other hyperlipidemia: Secondary | ICD-10-CM

## 2021-07-26 DIAGNOSIS — F908 Attention-deficit hyperactivity disorder, other type: Secondary | ICD-10-CM

## 2021-07-26 DIAGNOSIS — M21612 Bunion of left foot: Secondary | ICD-10-CM

## 2021-07-26 DIAGNOSIS — Z0000 Encounter for general adult medical examination without abnormal findings: Secondary | ICD-10-CM

## 2021-07-26 DIAGNOSIS — L732 Hidradenitis suppurativa: Secondary | ICD-10-CM

## 2021-07-26 DIAGNOSIS — Z1211 Encounter for screening for malignant neoplasm of colon: Secondary | ICD-10-CM

## 2021-07-26 DIAGNOSIS — K5900 Constipation, unspecified: Secondary | ICD-10-CM

## 2021-07-26 MED ORDER — ATORVASTATIN CALCIUM 20 MG PO TABS
20 mg | ORAL_TABLET | Freq: Every evening | ORAL | 1 refills | Status: AC
Start: 2021-07-26 — End: ?

## 2021-07-26 MED ORDER — POLYETHYLENE GLYCOL 3350 17 GM/SCOOP PO POWD
17 g | Freq: Every day | ORAL | 1 refills | Status: AC | PRN
Start: 2021-07-26 — End: ?

## 2021-07-26 MED ORDER — DOXYCYCLINE HYCLATE 100 MG PO CAPS
100 mg | ORAL_CAPSULE | Freq: Every day | ORAL | 1 refills | Status: AC
Start: 2021-07-26 — End: ?

## 2021-07-26 NOTE — Progress Notes
[facility] FAMILY MEDICINE CLINIC - WELL WOMAN EXAM    PATIENT: Tiffany Richards  MRN: [id number]  DOB: 1955-10-29  DATE OF SERVICE: 07/26/2021    PRIMARY CARE PROVIDER: Chrissie Noa, MD  REFERRING PROVIDER: No ref. provider found  Chief Complaint   Patient presents with   ? Annual Exam      Patient Active Problem List   Diagnosis   ? Hyperlipemia   ? Attention deficit disorder   ? Prediabetes   ? Former smoker   ? Controlled substance agreement signed   ? DJD (degenerative joint disease)       Subjective:   Tiffany Richards is a 66 y.o. female here for preventive exam.    #Bunion  L foot, has had for years but getting worse  Would like to see podiatry  Previously only pain at night now all the time    #Preventive medicine:  Diet:  Adequate fruits and vegetables [x]  yes   []  no  Exercise:  At least 150 min per week? [x]  yes   []  no  Walking 10k steps a day, stay active    Screenings:  Mood:   Depression Screening (Patient Health Questionnaire PHQ) 04/28/2019 06/09/2020 07/26/2021   PHQ-2: Feeling down, depressed, or hopeless Yes - No   PHQ-2: Little interest or pleassure in doing things - - No   Little interest or pleasure in doing things Several days Not at all -   Feeling down, depressed, or hopeless More than half the days Not at all -   Trouble falling or staying asleep, or sleeping too much Not at all - -   Feeling tired or having little energy More than half the days - -   Poor appetite or overeating Not at all - -   Feeling bad about yourself - or that you are a failure or have let yourself or your family down Several days - -   Trouble concentrating on things, such as reading the newspaper or watching television Nearly every day - -   Moving or speaking so slowly Or being so fidgety or restless More than half the days - -   Thoughts that you would be better off dead, or of hurting yourself in some way Not at all - -   Total Score 11 0 -     Breast cancer: family history: mammo UTD  Cervical cancer: Pap/cotesting NA, s/p hysterectomy  Colon cancer: family history none, colonoscopy due    No data recorded    Past Medical History:   Diagnosis Date   ? Anxiety    ? Arthritis    ? Hyperlipidemia    ,   Past Surgical History:   Procedure Laterality Date   ? CESAREAN SECTION  08/27/1991   ? JOINT REPLACEMENT  10/2014    L knee   ? TUBAL LIGATION  08/27/1991   ,   Family History   Problem Relation Age of Onset   ? Breast cancer Mother         late 8s   ? CABG Father    ? Epilepsy Brother    ? Colon cancer Neg Hx    ? Diabetes Neg Hx    ,   Social History     Socioeconomic History   ? Marital status: Married   Tobacco Use   ? Smoking status: Former     Packs/day: 0.00     Years: 0.00     Pack years: 0.00  Types: Cigarettes     Quit date: 2016     Years since quitting: 7.0   ? Smokeless tobacco: Never   ? Tobacco comments:     15 pack years   Substance and Sexual Activity   ? Alcohol use: Not Currently     Comment: 4 beers per week   ? Drug use: Never   ? Sexual activity: Yes     Partners: Male     Birth control/protection: Female Sterilization     Comment: monogamous   Social History Narrative    06/09/2020    Has 3 kids, from [place] then moved to [place] for 12 years, then moved to [place] to live with mom and help her for a few years.  Moved here for husband's job (working on project at [employer]), about 18 months ago.  Kids all grown, do not live here.  Oldest son passed away ~6 years ago from CO poisoning.  Still dealing with the grief, is Catholic but not very religious.  No therapy at this time.       Allergies   Allergen Reactions   ? Sulfa Antibiotics Hives and Rash     Other reaction(s): Rash/Hives/Urticaria  And rash all over  And rash all over  And rash all over   ? Morphine Hives, Itching and Rash   ? Hydrocodone Itching and Rash      Outpatient Medications Marked as Taking for the 07/26/21 encounter (Office Visit) with Baldo Ash, MD   Medication   ? amphetamine-dextroamphetamine 15 mg tablet   ? ascorbic acid 500 mg tablet ? Calcium Carbonate-Vitamin D 600-400 MG-UNIT per tablet   ? coenzyme Q10 200 mg capsule   ? diclofenac 1% gel   ? Multiple Vitamin (THERA-TABS) TABS   ? vitamin E 400 unit capsule   ? [DISCONTINUED] ATORVASTATIN 20 mg tablet   ? [DISCONTINUED] doxycycline 100 mg capsule       Review of Systems:   14 pt ROS reviewed and is negative except per HPI    Objective:   Physical Exam:  BP 125/84  ~ Pulse (!) 106  ~ Temp 36.7 ?C (98 ?F)  ~ Ht 5' 4'' (1.626 m)  ~ Wt 149 lb (67.6 kg)  ~ SpO2 100%  ~ BMI 25.58 kg/m?   General: well-appearing, no distress  HEENT: NC/AT, EOMI, PERRL, anicteric sclera, TMs clear b/l  Neck: symmetric, no thyromegaly, no LAD  CV: RRR, no murmurs, rubs, gallops, distal pulses intact b/l  Pulm: CTAB, normal effort, good air movement  Abd: soft, NT/ND  Extremities: normal strength and tone, no LE edema   Skin: warm and dry, no rashes  Neuro: non-focal, sensation and motor grossly intact      Lab/Imaging Review:    I have:    [x]  Reviewed/ordered  []  1  []  2  [x]  ? 3 unique laboratory, radiology, and/or diagnostic tests noted below.   []  Reviewed  []  1  []  2  []  ? 3 prior external notes and incorporated into patient assessment.   []  Discussed management or test interpretation with external provider(s) as noted.   []  Obtained history from someone other than patient   []  Decided to get outside medical records     []  Independently interpretated studies    LabStudies:  Last CBC:   Results for orders placed or performed in visit on 06/02/19   CBC   Result Value Ref Range    White Blood Cell Count 7.15 4.16 -  9.95 x10E3/uL    Red Blood Cell Count 4.69 3.96 - 5.09 x10E6/uL    Hemoglobin 14.6 11.6 - 15.2 g/dL    Hematocrit 65.7 84.6 - 45.2 %    Mean Corpuscular Volume 96.4 79.3 - 98.6 fL    Mean Corpuscular Hemoglobin 31.1 26.4 - 33.4 pg    MCH Concentration 32.3 31.5 - 35.5 g/dL    Red Cell Distribution Width-SD 45.4 36.9 - 48.3 fL    Red Cell Distribution Width-CV 12.8 11.1 - 15.5 %    Platelet Count, Auto 373 143 - 398 x10E3/uL    Mean Platelet Volume 10.2 9.3 - 13.0 fL    Nucleated RBC%, automated 0.0 No Ref. Range %    Absolute Nucleated RBC Count 0.00 0.00 - 0.00 x10E3/uL    Neutrophil Abs (Prelim) 4.02 See Absolute Neut Ct. x10E3/uL   Differential, Automated   Result Value Ref Range    Neutrophil Percent, Auto 56.2 No Ref. Range %    Lymphocyte Percent, Auto 31.5 No Ref. Range %    Monocyte Percent, Auto 8.5 No Ref. Range %    Eosinophil Percent, Auto 2.5 No Ref. Range %    Basophil Percent, Auto 1.0 No Ref. Range %    Immature Granulocytes% 0.3 No Reference Range %    Absolute Neut Count 4.02 1.80 - 6.90 x10E3/uL    Absolute Lymphocyte Count 2.25 1.30 - 3.40 x10E3/uL    Absolute Mono Count 0.61 0.20 - 0.80 x10E3/uL    Absolute Eos Count 0.18 0.00 - 0.50 x10E3/uL    Absolute Baso Count 0.07 0.00 - 0.10 x10E3/uL    Absolute Immature Gran Count 0.02 0.00 - 0.04 x10E3/uL   CBC & Auto Differential    Narrative    The following orders were created for panel order CBC & Auto Differential.  Procedure                               Abnormality         Status                     ---------                               -----------         ------                     NGE[[id number]]                                              Final result               Differential, Automated[[id number]]                          Final result                 Please view results for these tests on the individual orders.     Last CMP:   Results for orders placed or performed in visit on 12/12/20   Comprehensive Metabolic Panel   Result Value Ref Range    Sodium 141 135 - 146 mmol/L    Potassium 5.2 3.6 - 5.3 mmol/L  Chloride 105 96 - 106 mmol/L    Total CO2 24 20 - 30 mmol/L    Anion Gap 12 8 - 19 mmol/L    Glucose 93 65 - 99 mg/dL    Creatinine 1.61 0.96 - 1.30 mg/dL    Estimated GFR >04 See GFR Additional Information mL/min/1.93m2    GFR Additional Information See Comment     Urea Nitrogen 15 7 - 22 mg/dL    Calcium 9.5 8.6 - 54.0 mg/dL Total Protein 7.0 6.1 - 8.2 g/dL    Albumin 4.4 3.9 - 5.0 g/dL    Bilirubin,Total 0.5 0.1 - 1.2 mg/dL    Alkaline Phosphatase 90 37 - 113 U/L    Aspartate Aminotransferase 31 13 - 62 U/L    Alanine Aminotransferase 26 8 - 70 U/L     Last Hgb A1C:   Lab Results   Component Value Date/Time    HGBA1C 5.9 (H) 12/12/2020 09:43 AM     Last Lipids:   Results for orders placed or performed in visit on 12/12/20   Lipid Panel   Result Value Ref Range    Cholesterol 195 See Comment mg/dL    Cholesterol,LDL,Calc 110 (H) <100 mg/dL    Cholesterol, HDL 64 >50 mg/dL    Triglycerides 981 <191 mg/dL    Non-HDL,Chol,Calc 478 (H) <130 mg/dL       Imaging Studies:  None and Last MMG, Screening, Tomosynthesis:   Results for orders placed or performed during the hospital encounter of 07/22/21   Mammo tomosynthesis, screening, bilat breast    Narrative    [facility] HEALTH RADIOLOGY REPORT  [facility]  [address]  [place], [place]  [address]     Patient: Ozzie, Remmers  DOB: 1956-01-26 Age: 10 - female  MRN: [id number]  Referring Physician: Chrissie Noa  ___________________________________________________________________________     Exam Date: 07/22/2021     BILATERAL SCREENING MAMMOGRAPHY WITH DIGITAL BREAST TOMOSYNTHESIS     HISTORY:  Patient is 66 years old and is seen for screening.     Based on a modified Tyrer-Cuzick version 8 risk calculator, this patient has an approximate 6.9%  lifetime risk of developing breast cancer. A risk score over 20% is considered high risk.     FILMS COMPARED:  The present examination has been compared to prior imaging studies.     MAMMOGRAM TECHNIQUE:  The following mammography views with 3D digital breast tomosynthesis and 2D synthetic mammography  were obtained: bilateral craniocaudal with tomosynthesis and mediolateral oblique with tomosynthesis.     Computer-aided detection was utilized by the radiologist in the interpretation of this examination.     MAMMOGRAM FINDINGS:     There are scattered areas of fibroglandular densities (density B).     No suspicious masses, calcifications or other abnormalities are seen in either breast.     IMPRESSION:       Impression    There is no mammographic evidence of malignancy.     Routine screening mammogram in 1 year is recommended.     BI-RADS Category 1:  Negative           Report Electronically Signed by: Glennis Brink 07/26/2021     Technologist: Alcario Drought                                  Provider Note Review:  Assessment & Plan:   Diagnoses and all orders for this visit:    Encounter for annual health examination  -     doxycycline 100 mg capsule; Take 1 capsule (100 mg total) by mouth daily.  -     atorvastatin 20 mg tablet; Take 1 tablet (20 mg total) by mouth at bedtime.  -     polyethylene glycol powder; Take 17 g by mouth daily as needed (constipation).  -     CBC & Auto Differential; Future; Expected date: 07/26/2021  -     Comprehensive Metabolic Panel; Future; Expected date: 07/26/2021  -     Lipid Panel; Future; Expected date: 07/26/2021  -     Hgb A1c; Future; Expected date: 07/26/2021  -     Referral for Colonoscopy Procedure  -     Referral to Podiatry    Hidradenitis suppurativa  -     doxycycline 100 mg capsule; Take 1 capsule (100 mg total) by mouth daily.    Other hyperlipidemia  -     atorvastatin 20 mg tablet; Take 1 tablet (20 mg total) by mouth at bedtime.  -     Lipid Panel; Future; Expected date: 07/26/2021    Constipation, unspecified constipation type  -     polyethylene glycol powder; Take 17 g by mouth daily as needed (constipation).    Prediabetes  -     Comprehensive Metabolic Panel; Future; Expected date: 07/26/2021  -     Hgb A1c; Future; Expected date: 07/26/2021    Attention deficit hyperactivity disorder (ADHD), other type  -     CBC & Auto Differential; Future; Expected date: 07/26/2021    Bunion of great toe of left foot  -     Referral to Podiatry    Screening for colon cancer  - Referral for Colonoscopy Procedure      No follow-ups on file.    Author:  Baldo Ash, MD 07/26/2021 10:42 AM

## 2021-07-26 NOTE — Patient Instructions
For labs, you can walk into [facility] (no appointment needed):     [facility]  [address]  [place], [place] [address]  Hours: Monday-Friday, 8 am to 5 pm     You can text [phone] with ''[email or web address]'' ahead of time to get in line virtually to minimize waiting in line in person.    You will then receive text message to confirm your place in line (with estimate on wait time).        -----------------------------------------------------------------  To schedule your colonoscopy, please call:    [facility]   Drs. Didi Mwengela, Nimah Ather, Georgetta Haber, Jannet Mantis  [address]  [place], [place] [address]  [phone]      ----------------------------------------------------  For podiatry I would recommend:     [facility]   Dr. Elza Rafter   [address]   [phone]     [facility]  Drs. [address] Chul Wyn Forster, [address] Juanetta Gosling  [address]   [place], [place] [address]   [phone]

## 2021-08-21 MED ORDER — AMPHETAMINE-DEXTROAMPHETAMINE 15 MG PO TABS
15 mg | ORAL_TABLET | Freq: Three times a day (TID) | ORAL | 0 refills
Start: 2021-08-21 — End: ?

## 2021-08-23 MED ORDER — AMPHETAMINE-DEXTROAMPHETAMINE 15 MG PO TABS
15 mg | ORAL_TABLET | Freq: Three times a day (TID) | ORAL | 0 refills | Status: AC
Start: 2021-08-23 — End: ?

## 2021-08-24 ENCOUNTER — Non-Acute Institutional Stay: Payer: BLUE CROSS/BLUE SHIELD

## 2021-08-24 ENCOUNTER — Non-Acute Institutional Stay: Payer: PRIVATE HEALTH INSURANCE

## 2021-09-21 MED ORDER — AMPHETAMINE-DEXTROAMPHETAMINE 15 MG PO TABS
15 mg | ORAL_TABLET | Freq: Three times a day (TID) | ORAL | 0 refills | Status: AC
Start: 2021-09-21 — End: ?

## 2021-10-20 MED ORDER — AMPHETAMINE-DEXTROAMPHETAMINE 15 MG PO TABS
15 mg | ORAL_TABLET | Freq: Three times a day (TID) | ORAL | 0 refills
Start: 2021-10-20 — End: ?

## 2021-10-21 MED ORDER — AMPHETAMINE-DEXTROAMPHETAMINE 15 MG PO TABS
15 mg | ORAL_TABLET | Freq: Three times a day (TID) | ORAL | 0 refills | Status: AC
Start: 2021-10-21 — End: ?

## 2021-11-02 ENCOUNTER — Institutional Professional Consult (permissible substitution): Payer: PRIVATE HEALTH INSURANCE

## 2021-11-02 DIAGNOSIS — Z01818 Encounter for other preprocedural examination: Secondary | ICD-10-CM

## 2021-11-02 DIAGNOSIS — F908 Attention-deficit hyperactivity disorder, other type: Secondary | ICD-10-CM

## 2021-11-02 DIAGNOSIS — E7849 Other hyperlipidemia: Secondary | ICD-10-CM

## 2021-11-02 NOTE — Progress Notes
[facility] PRIMARY CARE    ** PREOPERATIVE MEDICINE CONSULTATION NOTE **     Patient Name: Tiffany Richards   Patient MRN: [id number]   Date of Birth: 01-21-1956   Date of Consultation: 11/02/2021        PRIMARY CARE PHYSICIAN:  Chrissie Noa, MD  SUBSPECIALTY PHYSICIANS:  [facility]  REFERRING PHYSICIAN/SURGEON:  As above  PLANNED SURGERY:  L bunionectomy  PLANNED SURGERY DATE:  11/11/2021  PLANNED ANESTHESIA:  Local/IV sedation, outpatient    CHIEF COMPLAINT / REASON FOR CONSULTATION:  Preoperative medical evaluation.     SOURCE OF INFORMATION:  The following history was generated by an interview with Isidor Holts as well as a review of medical records available in [facility].    HISTORY OF PRESENT ILLNESS:  Tiffany Richards is a 66 y.o. female with a history of HLD, PreDM, ADD, former smoker who is presenting to the [facility] Preoperative [facility] Consultation [facility] today for preoperative evaluation prior to procedure listed above    The patient has not history of coronary artery disease, cardiac arrhythmia, valvular heart disease, peripheral vascular disease, cerebrovascular disease, heart failure, diabetes mellitus, or renal disease.     The patient is independent with basic activities of daily living. The patient is able to achieve > 4 Mets without chest pain, dyspnea, or presyncope/syncope.     OSA: no concerns    PAST MEDICAL/SURGICAL HISTORY:   Past Medical History:   Diagnosis Date   ? Anxiety    ? Arthritis    ? Hyperlipidemia      Past Surgical History:   Procedure Laterality Date   ? ABDOMINAL HYSTERECTOMY  1995    fibroids, benign.  Per pt, no need for further pap   ? CESAREAN SECTION  08/27/1991   ? JOINT REPLACEMENT  10/2014    L knee   ? TUBAL LIGATION  08/27/1991       no complications with anesthesia for surgical procedures listed above.     MEDICATIONS:   Current Outpatient Medications   Medication Sig   ? amphetamine-dextroamphetamine 15 mg tablet Take 1 tablet (15 mg total) by mouth three (3) times daily. Max Daily Amount: 45 mg   ? ascorbic acid 500 mg tablet Take 1 tablet (500 mg total) by mouth.   ? atorvastatin 20 mg tablet Take 1 tablet (20 mg total) by mouth at bedtime.   ? Calcium Carbonate-Vitamin D 600-400 MG-UNIT per tablet Take 1 tablet by mouth.   ? coenzyme Q10 200 mg capsule Take 1 capsule (200 mg total) by mouth.   ? diclofenac 1% gel Apply topically four (4) times daily .   ? doxycycline 100 mg capsule Take 1 capsule (100 mg total) by mouth daily.   ? Multiple Vitamin (THERA-TABS) TABS Take 1 tablet by mouth .   ? polyethylene glycol powder Take 17 g by mouth daily as needed (constipation).   ? vitamin E 400 unit capsule Take 1 capsule (180 mg total) by mouth.     No current facility-administered medications for this visit.       ALLERGIES: Sulfa antibiotics, Morphine, and Hydrocodone    SOCIAL HISTORY:   reports that she quit smoking about 7 years ago. Her smoking use included cigarettes. She has never used smokeless tobacco. She reports that she does not currently use alcohol. She reports that she does not use drugs.    FAMILY HISTORY: family history includes Breast cancer in her mother; CABG in her father; Epilepsy  in her brother..  no family history of anesthetic complications.     ROS: A 14-system review was performed and was otherwise negative except as note above in the HPI and as follows:      PHYSICAL EXAMINATION:   Vital signs: BP 127/85  ~ Pulse (!) 109  ~ Temp 36.2 ?C (97.2 ?F)  ~ Ht 5' 4'' (1.626 m)  ~ Wt 146 lb 3.2 oz (66.3 kg)  ~ SpO2 99%  ~ BMI 25.10 kg/m?   Constitutional: WDWN. Appropriately groomed. NAD.  Eyes: PERRL. Conjunctiva pink. Sclera anicteric.  ENT: Hearing intact to voice. OP clear without erythema, exudate, or thrush.  Neck: Supple. Trachea midline. No thyromegaly.  CV: RRR. No murmurs. JVP <5-cm. No lower extremity edema. Radial pulses 2+ bilaterally.  No carotid bruits appreciated bilaterally on auscultation.   Respiratory: Clear to auscultation bilaterally. No wheezing. Good inspiratory effort.  GI: Soft. NT. ND. Normal bowel sounds. No HSM appreciated.  Skin: Warm, dry, well-perfused. No jaundice. No rashes.   MSK: Normal muscle bulk, normal muscle tone, and 5/5 motor strength in all 4 extremities.  No clubbing. No cyanosis.   Neurological: CN 2-12 grossly intact. Sensory grossly intact to light touch throughout. Biceps DTR symmetric bilaterally.  Psychiatric: Oriented to person, place, time, and situation. Normal mood and affect. Good judgment and insight.    LABORATORY STUDIES:   Lab Results   Component Value Date    NA 140 08/24/2021    K 4.3 08/24/2021    CL 103 08/24/2021    CO2 23 08/24/2021    BUN 12 08/24/2021    CREAT 0.73 08/24/2021    GLUCOSE 114 (H) 08/24/2021    CALCIUM 9.8 08/24/2021     CrCl cannot be calculated (Patient's most recent lab result is older than the maximum 7 days allowed.).    Lab Results   Component Value Date    ALT 27 08/24/2021    AST 35 08/24/2021    BILITOT 0.5 08/24/2021    ALKPHOS 80 08/24/2021    ALBUMIN 4.4 08/24/2021     Lab Results   Component Value Date    HGBA1C 5.9 (H) 08/24/2021    CHOL 180 08/24/2021    CHOLHDL 65 08/24/2021    CHOLDLCAL 98 08/24/2021    TRIGLY 87 08/24/2021     Lab Results   Component Value Date    WBC 6.86 08/24/2021    HGB 14.5 08/24/2021    HCT 44.8 08/24/2021    MCV 94.5 08/24/2021    PLT 375 08/24/2021       RADIOGRAPHIC STUDIES:   ? CXR pending    OTHER STUDIES:   ? EKG was preformed and interpretted by me in clinic today: Sinus Tachycardia no acute ST elevation/depression or T wave inversion        =======================================================================    PROBLEM LIST:   ? Preoperative medical evaluation.   ? HLD  ? preDM  ? ADD    =======================================================================    PERIOPERATIVE CARDIAC RISK ASSESSMENT     ? Emergency surgery?  No  ? Acute coronary syndrome or other ACC/AHA-defined active cardiac conditions: none  ? Patient's functional capacity:  > or = 4 METS.   ? Patient's ASA physical status classification:  I: Healthy, no disease outside surgical process.  ? Revised cardiac risk index (RCRI) = 3.9  ? ACS NSQIP calculated risk for major adverse cardiac events (MACE) = 0.9%.  RECOMMENDATIONS     ? The patient is medically optimized for the proposed surgical procedure.  The patient may proceed with the proposed surgical procedure at this time.   ? Further preoperative laboratory and/or diagnostic studies (CXR, EKG, TTE, cardiac stress testing, etc.) are not indicated.  Refer to my orders in [facility].  ? Perioperative Beta Blockers: not indicated  ? Perioperative Statins: not indicated  ? Perioperative Aspirin: not on ASA   ? Perioperative Diabetes management: no hx of diabetes.  ? Perioperative Anticoagulation: not indicated.  ? Perioperative OSAH: no indication for additional OSAH evaluation at this time.  ? Perioperative Steroids: not indicated  ?   ? The patient was advised to avoid use of aspirin-containing products and NSAID-containing products at least 7 days prior to surgery and avoid use of herbal medications/supplements at least 14 days prior to surgery.        Chrissie Noa MD, MPH 11/02/2021 8:41 AM

## 2021-11-19 MED ORDER — AMPHETAMINE-DEXTROAMPHETAMINE 15 MG PO TABS
15 mg | ORAL_TABLET | Freq: Three times a day (TID) | ORAL | 0 refills
Start: 2021-11-19 — End: ?

## 2021-11-22 MED ORDER — AMPHETAMINE-DEXTROAMPHETAMINE 15 MG PO TABS
15 mg | ORAL_TABLET | Freq: Three times a day (TID) | ORAL | 0 refills | Status: AC
Start: 2021-11-22 — End: ?

## 2021-12-22 MED ORDER — AMPHETAMINE-DEXTROAMPHETAMINE 15 MG PO TABS
15 mg | ORAL_TABLET | Freq: Three times a day (TID) | ORAL | 0 refills | Status: AC
Start: 2021-12-22 — End: ?

## 2022-01-19 MED ORDER — AMPHETAMINE-DEXTROAMPHETAMINE 15 MG PO TABS
15 mg | ORAL_TABLET | Freq: Three times a day (TID) | ORAL | 0 refills
Start: 2022-01-19 — End: ?

## 2022-01-19 MED ORDER — DOXYCYCLINE HYCLATE 100 MG PO CAPS
100 mg | ORAL_CAPSULE | Freq: Every day | ORAL | 1 refills
Start: 2022-01-19 — End: ?

## 2022-01-24 ENCOUNTER — Ambulatory Visit: Payer: PRIVATE HEALTH INSURANCE

## 2022-01-24 DIAGNOSIS — Z808 Family history of malignant neoplasm of other organs or systems: Secondary | ICD-10-CM

## 2022-01-24 DIAGNOSIS — F908 Attention-deficit hyperactivity disorder, other type: Secondary | ICD-10-CM

## 2022-01-24 DIAGNOSIS — L732 Hidradenitis suppurativa: Secondary | ICD-10-CM

## 2022-01-24 MED ORDER — AMPHETAMINE-DEXTROAMPHETAMINE 15 MG PO TABS
15 mg | ORAL_TABLET | Freq: Three times a day (TID) | ORAL | 0 refills | Status: AC
Start: 2022-01-24 — End: ?

## 2022-01-24 MED ORDER — DOXYCYCLINE HYCLATE 100 MG PO CAPS
100 mg | ORAL_CAPSULE | Freq: Every day | ORAL | 3 refills | Status: AC
Start: 2022-01-24 — End: ?

## 2022-01-24 NOTE — Patient Instructions
For dermatology, you can call:     [facility]  [address]  [place], [place] [address]  [phone]   Drs. Anabella Pascucci      [address] [place]  [address]  [place], [place] [address]  [phone]  --------------------------------------------------------------------

## 2022-01-24 NOTE — Progress Notes
[facility] NOTE    PATIENT: Tiffany Richards  MRN: [id number]  DOB: [birth year]  DATE OF SERVICE: 01/24/2022    PRIMARY CARE PROVIDER: Chrissie Noa, MD  REFERRING PROVIDER: No ref. provider found    Chief Complaint   Patient presents with   ? Follow-up     23mo     Patient Active Problem List   Diagnosis   ? Hyperlipemia   ? Attention deficit disorder   ? Prediabetes   ? Former smoker   ? Controlled substance agreement signed   ? DJD (degenerative joint disease)       Subjective:   Tiffany Richards is a 66 y.o. female with above PMH who presents for:    #Bunion  L foot, has had for years but getting worse  Would like to see podiatry  Previously only pain at night now all the time    #Hidradenitis suppurativa  Taking doxycycline 100mg  daily, doing well    #HLD  Takes lipitor 20mg     #Pre-DM  Last A1c 5.9  Diet has been low carb, but noticing BG higher  Max fasting around 140, usually 100  +gestational DM  Had bunion surgery, still swollen  Concerned about healing    #Constipation  Using miralax rarely, works well     #ADHD  Needs refill, doing well  No side effects    #Family history melanoma  Needs derm for skin check      Past medical, surgical, family, and social history were reviewed and non-contributory except as noted in HPI.    Outpatient Medications Marked as Taking for the 01/24/22 encounter (Office Visit) with Baldo Ash, MD   Medication   ? ascorbic acid 500 mg tablet   ? atorvastatin 20 mg tablet   ? Calcium Carbonate-Vitamin D 600-400 MG-UNIT per tablet   ? Coenzyme Q10 100 MG capsule   ? diclofenac 1% gel   ? Multiple Vitamin (THERA-TABS) TABS   ? polyethylene glycol powder   ? vitamin E 400 unit capsule   ? [DISCONTINUED] amphetamine-dextroamphetamine 15 mg tablet   ? [DISCONTINUED] doxycycline 100 mg capsule       Allergies   Allergen Reactions   ? Sulfa Antibiotics Hives and Rash     Other reaction(s): Rash/Hives/Urticaria  And rash all over  And rash all over  And rash all over   ? Morphine Hives, Itching and Rash   ? Hydrocodone Itching and Rash       Review of Systems:  As per HPI    Objective:   BP 116/79  ~ Pulse (!) 103  ~ Temp 36.7 ?C (98.1 ?F)  ~ Ht 5' 4'' (1.626 m)  ~ Wt 148 lb (67.1 kg)  ~ SpO2 99%  ~ BMI 25.40 kg/m?   Physical Exam  HENT:      Head: Normocephalic and atraumatic.   Eyes:      General: No scleral icterus.     Conjunctiva/sclera: Conjunctivae normal.   Cardiovascular:      Rate and Rhythm: Normal rate and regular rhythm.      Heart sounds: Normal heart sounds. No murmur heard.     No friction rub. No gallop.   Pulmonary:      Effort: Pulmonary effort is normal. No respiratory distress.      Breath sounds: Normal breath sounds. No wheezing or rales.   Musculoskeletal:      Cervical back: Neck supple.   Neurological:  Mental Status: She is alert and oriented to person, place, and time.         Lab/Imaging Review:    I have:    [x]  Reviewed/ordered  []  1  []  2  [x]  ? 3 unique laboratory, radiology, and/or diagnostic tests noted below.   []  Reviewed  []  1  []  2  []  ? 3 prior external notes and incorporated into patient assessment.   []  Discussed management or test interpretation with external provider(s) as noted.   []  Obtained history from someone other than patient   []  Decided to get outside medical records     []  Independently interpretated studies    Lab Studies:   Last CBC:   Results for orders placed or performed in visit on 11/02/21   CBC   Result Value Ref Range    White Blood Cell Count 6.94 4.16 - 9.95 x10E3/uL    Red Blood Cell Count 4.58 3.96 - 5.09 x10E6/uL    Hemoglobin 14.0 11.6 - 15.2 g/dL    Hematocrit 16.1 09.6 - 45.2 %    Mean Corpuscular Volume 94.1 79.3 - 98.6 fL    Mean Corpuscular Hemoglobin 30.6 26.4 - 33.4 pg    MCH Concentration 32.5 31.5 - 35.5 g/dL    Red Cell Distribution Width-SD 46.9 36.9 - 48.3 fL    Red Cell Distribution Width-CV 13.5 11.1 - 15.5 %    Platelet Count, Auto 365 143 - 398 x10E3/uL    Mean Platelet Volume 9.8 9.3 - 13.0 fL Nucleated RBC%, automated 0.0 No Ref. Range %    Absolute Nucleated RBC Count 0.00 0.00 - 0.00 x10E3/uL    Neutrophil Abs (Prelim) 4.19 See Absolute Neut Ct. x10E3/uL   Differential, Automated   Result Value Ref Range    Neutrophil Percent, Auto 60.4 No Ref. Range %    Lymphocyte Percent, Auto 29.1 No Ref. Range %    Monocyte Percent, Auto 7.6 No Ref. Range %    Eosinophil Percent, Auto 1.9 No Ref. Range %    Basophil Percent, Auto 0.9 No Ref. Range %    Immature Granulocytes% 0.1 No Reference Range %    Absolute Neut Count 4.19 1.80 - 6.90 x10E3/uL    Absolute Lymphocyte Count 2.02 1.30 - 3.40 x10E3/uL    Absolute Mono Count 0.53 0.20 - 0.80 x10E3/uL    Absolute Eos Count 0.13 0.00 - 0.50 x10E3/uL    Absolute Baso Count 0.06 0.00 - 0.10 x10E3/uL    Absolute Immature Gran Count 0.01 0.00 - 0.04 x10E3/uL   CBC & Auto Differential    Narrative    The following orders were created for panel order CBC & Auto Differential.  Procedure                               Abnormality         Status                     ---------                               -----------         ------                     EAV[[id number]]  Final result               Differential, Automated[[id number]]                          Final result                 Please view results for these tests on the individual orders.     Last CMP:   Results for orders placed or performed in visit on 11/02/21   Comprehensive Metabolic Panel   Result Value Ref Range    Sodium 141 135 - 146 mmol/L    Potassium 4.4 3.6 - 5.3 mmol/L    Chloride 104 96 - 106 mmol/L    Total CO2 23 20 - 30 mmol/L    Anion Gap 14 8 - 19 mmol/L    Glucose 137 (H) 65 - 99 mg/dL    Creatinine 6.57 8.46 - 1.30 mg/dL    Estimated GFR >96 See GFR Additional Information mL/min/1.57m2    GFR Additional Information See Comment     Urea Nitrogen 15 7 - 22 mg/dL    Calcium 9.3 8.6 - 29.5 mg/dL    Total Protein 6.8 6.1 - 8.2 g/dL    Albumin 4.5 3.9 - 5.0 g/dL Bilirubin,Total 0.4 0.1 - 1.2 mg/dL    Alkaline Phosphatase 77 37 - 113 U/L    Aspartate Aminotransferase 30 13 - 62 U/L    Alanine Aminotransferase 19 8 - 70 U/L     Last Hgb A1C:   Lab Results   Component Value Date/Time    HGBA1C 5.9 (H) 08/24/2021 09:24 AM     Last Lipids:   Results for orders placed or performed in visit on 08/24/21   Lipid Panel   Result Value Ref Range    Cholesterol 180 See Comment mg/dL    Cholesterol,LDL,Calc 98 <100 mg/dL    Cholesterol, HDL 65 >50 mg/dL    Triglycerides 87 <284 mg/dL    Non-HDL,Chol,Calc 132 <130 mg/dL       Imaging Studies:   None   Provider Note Review:       Assessment/Plan:   Diagnoses and all orders for this visit:    Attention deficit hyperactivity disorder (ADHD), other type  -     amphetamine-dextroamphetamine 15 mg tablet; Take 1 tablet (15 mg total) by mouth three (3) times daily. Max Daily Amount: 45 mg    Hidradenitis suppurativa  -     doxycycline hyclate 100 mg capsule; Take 1 capsule (100 mg total) by mouth daily.    Family history of melanoma  -     Referral to Dermatology        Return in about 6 months (around 07/27/2022) for annual physical, w/ PCP.    Author:  Baldo Ash, MD 01/26/2022 9:21 AM

## 2022-02-11 MED ORDER — ATORVASTATIN CALCIUM 20 MG PO TABS
ORAL_TABLET | 1 refills
Start: 2022-02-11 — End: ?

## 2022-02-13 MED ORDER — ATORVASTATIN CALCIUM 20 MG PO TABS
ORAL_TABLET | 1 refills | Status: AC
Start: 2022-02-13 — End: ?

## 2022-02-22 MED ORDER — AMPHETAMINE-DEXTROAMPHETAMINE 15 MG PO TABS
15 mg | ORAL_TABLET | Freq: Three times a day (TID) | ORAL | 0 refills
Start: 2022-02-22 — End: ?

## 2022-02-24 MED ORDER — AMPHETAMINE-DEXTROAMPHETAMINE 15 MG PO TABS
15 mg | ORAL_TABLET | Freq: Three times a day (TID) | ORAL | 0 refills
Start: 2022-02-24 — End: ?

## 2022-02-27 ENCOUNTER — Telehealth: Payer: BLUE CROSS/BLUE SHIELD

## 2022-02-27 NOTE — Telephone Encounter
Pt called office to f/u on two requests made last week for medication refill for : amphetamine-dextroamphetamine 15 mg tablet   Please advise.

## 2022-02-27 NOTE — Telephone Encounter
PDL Call to Clinic    Reason for Call: has 2 MyChart encounters regarding rx refill of Amphetamine-Dextroamphetamine and stating no one has gotten back to her.  Encounter is over 48 hours. Per Shanda Bumps Dr. Sherryll Burger is out of the office today, but stated that Dr. Kathyrn Lass can refill and Shanda Bumps will call PT back by end of day today regarding the refill status.       Appointment Related?  []  Yes  [x]  No     If yes;  Date:  Time:     Call warm transferred to PDL: []  Yes  [x]  No    Call Received by Clinic Representative:    If call not answered/not accepted, call received by Patient Services Representative:

## 2022-02-27 NOTE — Telephone Encounter
Forwarded by: Shekelia Boutin Gabriela Myiah Petkus

## 2022-02-28 MED ORDER — AMPHETAMINE-DEXTROAMPHETAMINE 15 MG PO TABS
15 mg | ORAL_TABLET | Freq: Three times a day (TID) | ORAL | 0 refills | Status: AC
Start: 2022-02-28 — End: ?

## 2022-02-28 NOTE — Telephone Encounter
Reply by: Elsye Mccollister  Rx approved

## 2022-03-01 MED ORDER — AMPHETAMINE-DEXTROAMPHETAMINE 15 MG PO TABS
15 mg | ORAL_TABLET | Freq: Three times a day (TID) | ORAL | 0 refills
Start: 2022-03-01 — End: ?

## 2022-03-29 MED ORDER — AMPHETAMINE-DEXTROAMPHETAMINE 15 MG PO TABS
15 mg | ORAL_TABLET | Freq: Three times a day (TID) | ORAL | 0 refills | Status: AC
Start: 2022-03-29 — End: ?

## 2022-04-27 MED ORDER — AMPHETAMINE-DEXTROAMPHETAMINE 15 MG PO TABS
15 mg | ORAL_TABLET | Freq: Three times a day (TID) | ORAL | 0 refills | Status: AC
Start: 2022-04-27 — End: ?

## 2022-05-12 ENCOUNTER — Telehealth: Payer: PRIVATE HEALTH INSURANCE

## 2022-05-12 DIAGNOSIS — U071 COVID-19: Secondary | ICD-10-CM

## 2022-05-12 MED ORDER — NIRMATRELVIR&RITONAVIR 300/100 20 X 150 MG & 10 X 100MG PO TBPK
0 refills | Status: AC
Start: 2022-05-12 — End: ?

## 2022-05-12 NOTE — Progress Notes
[facility] HEALTH TELEMEDICINE COVID-19 (VIDEO) NOTE  Patient: Tiffany Richards  MRN: [id number]  Date of Service: 05/12/2022  Primary Care Physician: Chrissie Noa, MD  CC: Concern for COVID-19 and associated symptoms    History of Present Illness:   Tiffany Richards is a 66 y.o. female presenting with concern for COVID-19 and associated symptoms.    Symptom history, exposure history, and other risk factors detailed below:    Symptoms started 2 days ago. Symptoms started Wednesday with PND/sore throat and progressed to coughing/sneezing. Feels like sinus infection      Symptoms  [x]  Cough  [x]  congestion  []  Fever  []  GI symptoms (abdominal pain, diarrhea)  []  Shortness of Breath  []  Sore throat   []  Unable to smell/taste  [x]  headache     Risk Factors  []  Exposure to confirmed COVID-19 within 14 days prior to symptom onset.  []  Research scientist (physical sciences)  []  Immunocompromised or with high risk medical condition (pregnancy)  [x]  60 years or older  []  Lives with others who are immunocompromised or with high risk medical condition  []  Group Living home (nursing home, dorms, etc)  []  unvaccinated           Review of Systems  Check if present, blank if patient denies symptoms:  []  fever, []  chills, []  nausea, []  emesis, []  po intolerance, []  change in mentation, []  SOB, []  abdominal pain, []  any significant change in bowel function. All other systems negative except as documented in HPI.    History:     Current Outpatient Medications:   ?  amphetamine-dextroamphetamine 15 mg tablet  ?  ascorbic acid 500 mg tablet  ?  ATORVASTATIN 20 mg tablet  ?  Calcium Carbonate-Vitamin D 600-400 MG-UNIT per tablet  ?  Coenzyme Q10 100 MG capsule  ?  coenzyme Q10 200 mg capsule  ?  diclofenac 1% gel  ?  doxycycline hyclate 100 mg capsule  ?  Multiple Vitamin (THERA-TABS) TABS  ?  polyethylene glycol powder  ?  vitamin E 400 unit capsule  Allergies   Allergen Reactions   ? Sulfa Antibiotics Hives and Rash     Other reaction(s): Rash/Hives/Urticaria  And rash all over  And rash all over  And rash all over   ? Morphine Hives, Itching and Rash   ? Hydrocodone Itching and Rash     Patient Active Problem List   Diagnosis   ? Hyperlipemia   ? Attention deficit disorder   ? Prediabetes   ? Former smoker   ? Controlled substance agreement signed   ? DJD (degenerative joint disease)       Past medical history, past surgical history, family history, and social history all reviewed, updated, and noted in HPI if significant.     Physical Exam:     (The following were examined and were normal unless noted otherwise)  Gen  [x]  Appearance  [x]  Alert    Eyes  [x]  Conjunctiva  [x]  Pupils  [x]  Sclera   ENT  []  Oropharynx  []  Nasal     Resp  [x]  Effort [x]  No signs of respiratory distress [x]  Able to speak in complete sentences   Skin  [x]  Color  [x]  No rashes    Neuro  [x]  Oriented  [x]   Speech  [x]  Movement     Pertinent + findings (if any):       Lab Results   Component Value Date    CREAT 0.67 11/02/2021    BUN 15  11/02/2021    NA 141 11/02/2021    K 4.4 11/02/2021    CL 104 11/02/2021    CO2 23 11/02/2021        Assessment & Plan:   Tiffany Richards is a 66 y.o. female presenting with concern for COVID-19 and associated symptoms.    Diagnoses and all orders for this visit:    COVID-19    Other orders  -     nirmatrelvir & ritonavir (PAXLOVID) 20 x 150 mg & 10 x 100 mg tablet; Take 300 mg nirmatrelvir (two 150 mg tablets) with 100 mg ritonavir (one 100 mg tablet). Take all three tablets together by mouth twice daily for 5 days..      No orders of the defined types were placed in this encounter.        DOS:2    Isolation precautions discussed  Counseled re Strategies to decrease risk of spread within household/community  Paxlovid EUA discussed including risks/benefits  Medication interactions reviewed. Interaction: atorvastatin and possibly Adderall. Hold atorvastatin 7 days. Ideally hold adderall, if unable rec dose decrease.    Body aches/chills/ha: tylenol 1g TID prn  Cough/sore throat: cepacol lozenges and delsym as needed  Congestion: saline irrigation/flonase        []  Patient referred for COVID testing.  []  Patient directed to go immediately to the local Emergency Department for further evaluation and possible hospitalization.  [x]  Additional education provided to patient on symptom management and follow up should they develop worsening symptoms including respiratory difficulties. Patient was also advised to inform healthcare providers and emergency responders of their current screening status should additional treatment be needed.      The plan of care, diagnosis, orders, risks and benefits related to the medical issues pertinent to this encounter, and follow-up recommendations were discussed with the patient. Questions related to the recommended plan of care were answered. See AVS for additional information and counseling materials provided to the patient.    Time of note filed does not necessarily reflect the time of encounter.  Portions of this note may have been created with voice recognition software. Occasional wrong-word or ''sound-alike'' substitutions may have occurred due to the inherent limitations of voice recognition software. Please read the chart carefully and recognize, using context, where these substitutions have occurred.    Patient Consent to Telehealth Questionnaire       06/30/2019     8:49 AM   [facility] TELEHEALTH PRECHECKIN QUESTIONS   By clicking ''I Agree'', I consent to the below:  I Agree     - I agree  to be treated via a video visit and acknowledge that I may be liable for any relevant copays or coinsurance depending on my insurance plan.  - I understand that this video visit is offered for my convenience and I am able to cancel and reschedule for an in-person appointment if I desire.  - I also acknowledge that sensitive medical information may be discussed during this video visit appointment and that it is my responsibility to locate myself in a location that ensures privacy to my own level of comfort.  - I also acknowledge that I should not be participating in a video visit in a way that could cause danger to myself or to those around me (such as driving or walking).  If my provider is concerned about my safety, I understand that they have the right to terminate the visit.     --  Author:  Chrissie Noa, MD  05/12/2022 at 11:56 AM

## 2022-05-23 DIAGNOSIS — Z1239 Encounter for other screening for malignant neoplasm of breast: Secondary | ICD-10-CM

## 2022-05-24 MED ORDER — AMPHETAMINE-DEXTROAMPHETAMINE 15 MG PO TABS
15 mg | ORAL_TABLET | Freq: Three times a day (TID) | ORAL | 0 refills
Start: 2022-05-24 — End: ?

## 2022-05-26 MED ORDER — AMPHETAMINE-DEXTROAMPHETAMINE 15 MG PO TABS
15 mg | ORAL_TABLET | Freq: Three times a day (TID) | ORAL | 0 refills | Status: AC
Start: 2022-05-26 — End: ?

## 2022-06-14 ENCOUNTER — Ambulatory Visit: Payer: BLUE CROSS/BLUE SHIELD

## 2022-07-20 MED ORDER — AMPHETAMINE-DEXTROAMPHETAMINE 15 MG PO TABS
15 mg | ORAL_TABLET | Freq: Three times a day (TID) | ORAL | 0 refills | Status: AC
Start: 2022-07-20 — End: ?

## 2022-08-01 ENCOUNTER — Ambulatory Visit: Payer: PRIVATE HEALTH INSURANCE

## 2022-08-18 MED ORDER — AMPHETAMINE-DEXTROAMPHETAMINE 15 MG PO TABS
15 mg | ORAL_TABLET | Freq: Three times a day (TID) | ORAL | 0 refills | Status: AC
Start: 2022-08-18 — End: ?

## 2022-09-04 ENCOUNTER — Ambulatory Visit: Payer: PRIVATE HEALTH INSURANCE | Attending: Student in an Organized Health Care Education/Training Program

## 2022-09-04 DIAGNOSIS — Z4802 Encounter for removal of sutures: Secondary | ICD-10-CM

## 2022-09-04 NOTE — Progress Notes
[facility] INTERNAL MEDICINE & PEDIATRICS  [facility]  [address] [place]  [address]  [place] [address]  Phone: [phone]  FAX: [phone]    URGENT CARE - ADULT    Subjective:     CC: Suture / Staple Removal        HPI:   Tiffany Richards is a 67 y.o. female with the above issues     Had Mohs surgery Jan 23.   Stiches removed  Then fell and hit file cabinet in [place]  The same area as prior surgery split open on right forehead  Went to [place] in [place], who placed stiches on January 20th.     Review of Systems  Review of Systems   All other systems reviewed and are negative.        Patient Active Problem List   Diagnosis    Hyperlipemia    Attention deficit disorder    Prediabetes    Former smoker    Controlled substance agreement signed    DJD (degenerative joint disease)       Past Medical History  She has a past medical history of Anxiety, Arthritis, and Hyperlipidemia.    Medications/Supplements  No outpatient medications have been marked as taking for the 09/04/22 encounter (Office Visit) with Beatrix Shipper., DO.       Objective:     Physical Exam  BP 137/89  ~ Pulse (!) 107  ~ Temp 36.4 ?C (97.5 ?F) (Temporal)  ~ SpO2 97%     Physical Exam  Constitutional:       General: She is not in acute distress.     Appearance: Normal appearance. She is not ill-appearing or toxic-appearing.   HENT:      Head:      Comments: Stiches in place on right forehead. Skin healing and well proximated. No erythema     Right Ear: External ear normal.      Left Ear: External ear normal.      Nose: Nose normal.   Eyes:      General:         Right eye: No discharge.         Left eye: No discharge.      Conjunctiva/sclera: Conjunctivae normal.   Pulmonary:      Effort: Pulmonary effort is normal. No respiratory distress.   Skin:     General: Skin is warm.   Neurological:      General: No focal deficit present.      Mental Status: She is alert.         Assessment/Plan:     1. Visit for suture removal        2. Fall, subsequent encounter          Wound instructions provided. Take tylenol/ibuprofen for pain    The above plan of care, diagnosis, orders, and follow-up were discussed with the patient.  Questions related to this recommended plan of care were answered.    Alexcis Bicking S. Donaldo Teegarden, DO  09/04/2022 at 9:21 AM

## 2022-09-08 ENCOUNTER — Inpatient Hospital Stay: Payer: PRIVATE HEALTH INSURANCE

## 2022-09-12 ENCOUNTER — Ambulatory Visit: Payer: BLUE CROSS/BLUE SHIELD

## 2022-09-12 DIAGNOSIS — Z1159 Encounter for screening for other viral diseases: Secondary | ICD-10-CM

## 2022-09-12 DIAGNOSIS — F908 Attention-deficit hyperactivity disorder, other type: Secondary | ICD-10-CM

## 2022-09-12 DIAGNOSIS — Z0000 Encounter for general adult medical examination without abnormal findings: Secondary | ICD-10-CM

## 2022-09-12 DIAGNOSIS — Z780 Asymptomatic menopausal state: Secondary | ICD-10-CM

## 2022-09-12 DIAGNOSIS — Z1211 Encounter for screening for malignant neoplasm of colon: Secondary | ICD-10-CM

## 2022-09-12 DIAGNOSIS — E7849 Other hyperlipidemia: Secondary | ICD-10-CM

## 2022-09-12 NOTE — Patient Instructions
For labs, you can walk into [facility] on the 1st floor of our building (no appointment needed) -                  [facility]              [address]              [place], [place] [address]               Hours: Monday - Friday, 8 am - 5 pm     You can text [phone] with ''[facility]'' ahead to get in line virtually to minimize waiting in line in person.  You will then receive text message to confirm your place in line (with estimate on wait time).  You also can download the ''QLess'' app, and it will go through similar process to get you in line virtually.  Please note that lines may be longer in the mornings.        I will send you copies of your results either online (if you are signed up with [facility]) or in the mail within 1-2 wks. Please reach out if you haven't heard from me after this period.     Please contact radiology to schedule an appointment at a center that is convenient for you at: [phone]

## 2022-09-12 NOTE — Progress Notes
PATIENT: Tiffany Richards  MRN: [id number]  DOB: 04-Feb-1956  DATE OF SERVICE: 09/12/2022    REFERRING PRACTITIONER: No ref. provider found  PRIMARY CARE PROVIDER: Katina Dung., MD      CHIEF COMPLAINT:   Chief Complaint   Patient presents with    Annual Exam        Subjective:      Tiffany Richards is a 67 y.o. female here for preventive exam       Exercise: walks regularly  Nutrition: fairly wel balanced  Mental Health: doing ok  Menses: post menopausal  Family Planning: n/a  STI Screen: n/a    HCM:  Mammogram: 09/2022  Pap smear: n/a s/p hysterecomty  Colonoscopy: last screen 10 years ago. No abnormal or change in BM. No Fhx colon cancer  Iz:   Advanced Directive: Tiffany Richards [phone].   Daughter Tiffany Richards [phone]  Full Code      Hx of basal cell Ca - s/p Mohs  R knee gave out - led to fall. Previously seeing Ortho benefit with durolane injections but no longer covered  ADD - on adderall 15mg  TID no adr. Long acting triggered mood symptoms  Daughter had baby - lives in [place] ([place]). Plans to visit in summer. First grandbaby!    Past Medical History:   Diagnosis Date    Anxiety     Arthritis     Hyperlipidemia    ,   Past Surgical History:   Procedure Laterality Date    ABDOMINAL HYSTERECTOMY  1995    fibroids, benign.  Per pt, no need for further pap    CESAREAN SECTION  08/27/1991    JOINT REPLACEMENT  10/2014    L knee    TUBAL LIGATION  08/27/1991   ,   Family History   Problem Relation Age of Onset    Breast cancer Mother         late 72s    CABG Father     Epilepsy Brother     Colon cancer Neg Hx     Diabetes Neg Hx    ,   Social History     Socioeconomic History    Marital status: Married   Tobacco Use    Smoking status: Former     Packs/day: 0.00     Years: 0.00     Additional pack years: 0.00     Total pack years: 0.00     Types: Cigarettes     Quit date: 2016     Years since quitting: 8.1    Smokeless tobacco: Never    Tobacco comments:     15 pack years   Substance and Sexual Activity    Alcohol use: Not Currently     Comment: 4 beers per week    Drug use: Never    Sexual activity: Yes     Partners: Male     Birth control/protection: Female Sterilization     Comment: monogamous   Social History Narrative    07/26/2021    Has 3 kids, from [place] then moved to [place] for 12 years, then moved to [place] to live with mom and help her for a few years.  Moved here for husband's job (working on project at [employer]), about 18 months ago.  Kids all grown, do not live here.  Oldest son passed away ~6 years ago from CO poisoning.  Still dealing with the grief, is Catholic but not very religious.  No therapy at this time.  and   Allergies   Allergen Reactions    Sulfa Antibiotics Hives and Rash     Other reaction(s): Rash/Hives/Urticaria  And rash all over  And rash all over  And rash all over    Morphine Hives, Itching and Rash    Hydrocodone Itching and Rash          Review of Systems:   ROS     Objective:        Vitals:  BP 122/79  ~ Pulse (!) 101  ~ Ht 5' 4'' (1.626 m)  ~ Wt 152 lb (68.9 kg)  ~ SpO2 100%  ~ BMI 26.09 kg/m?      Physical Exam  Vitals and nursing note reviewed.   Constitutional:       General: She is not in acute distress.     Appearance: Normal appearance. She is not ill-appearing, toxic-appearing or diaphoretic.   HENT:      Head: Normocephalic and atraumatic.      Right Ear: Tympanic membrane normal.      Left Ear: Tympanic membrane normal.   Eyes:      General: No scleral icterus.     Conjunctiva/sclera: Conjunctivae normal.      Pupils: Pupils are equal, round, and reactive to light.   Neck:      Thyroid: No thyromegaly or thyroid tenderness.      Vascular: No carotid bruit.   Cardiovascular:      Rate and Rhythm: Normal rate and regular rhythm.      Heart sounds: No murmur heard.  Pulmonary:      Effort: Pulmonary effort is normal.      Breath sounds: Normal breath sounds.   Abdominal:      Palpations: Abdomen is soft.      Tenderness: There is no abdominal tenderness.   Musculoskeletal: General: Normal range of motion.      Cervical back: Normal range of motion.      Right lower leg: No edema.      Left lower leg: No edema.   Skin:     General: Skin is warm and dry.      Capillary Refill: Capillary refill takes less than 2 seconds.   Neurological:      General: No focal deficit present.      Mental Status: She is alert and oriented to person, place, and time.      Motor: Motor function is intact.      Coordination: Coordination is intact.   Psychiatric:         Attention and Perception: Attention normal.         Mood and Affect: Mood normal.         Speech: Speech normal.         Behavior: Behavior normal.         Thought Content: Thought content normal.         Cognition and Memory: Cognition normal.         Judgment: Judgment normal.         Lab Review:   No visits with results within 2 Month(s) from this visit.   Latest known visit with results is:   Lab Visit on 11/02/2021   Component Date Value    APTT 11/02/2021 28.8     Prothrombin Time 11/02/2021 12.5     INR 11/02/2021 1.0     Sodium 11/02/2021 141     Potassium 11/02/2021 4.4     Chloride 11/02/2021 104  Total CO2 11/02/2021 23     Anion Gap 11/02/2021 14     Glucose 11/02/2021 137 (H)     Creatinine 11/02/2021 0.67     Estimated GFR 11/02/2021 >89     GFR Additional Informati* 11/02/2021 See Comment     Urea Nitrogen 11/02/2021 15     Calcium 11/02/2021 9.3     Total Protein 11/02/2021 6.8     Albumin 11/02/2021 4.5     Bilirubin,Total 11/02/2021 0.4     Alkaline Phosphatase 11/02/2021 77     Aspartate Aminotransfera* 11/02/2021 30     Alanine Aminotransferase 11/02/2021 19     White Blood Cell Count 11/02/2021 6.94     Red Blood Cell Count 11/02/2021 4.58     Hemoglobin 11/02/2021 14.0     Hematocrit 11/02/2021 43.1     Mean Corpuscular Volume 11/02/2021 94.1     Mean Corpuscular Hemoglo* 11/02/2021 30.6     MCH Concentration 11/02/2021 32.5     Red Cell Distribution Wi* 11/02/2021 46.9     Red Cell Distribution Wi* 11/02/2021 13.5 Platelet Count, Auto 11/02/2021 365     Mean Platelet Volume 11/02/2021 9.8     Nucleated RBC%, automated 11/02/2021 0.0     Absolute Nucleated RBC C* 11/02/2021 0.00     Neutrophil Abs (Prelim) 11/02/2021 4.19     Neutrophil Percent, Auto 11/02/2021 60.4     Lymphocyte Percent, Auto 11/02/2021 29.1     Monocyte Percent, Auto 11/02/2021 7.6     Eosinophil Percent, Auto 11/02/2021 1.9     Basophil Percent, Auto 11/02/2021 0.9     Immature Granulocytes% 11/02/2021 0.1     Absolute Neut Count 11/02/2021 4.19     Absolute Lymphocyte Count 11/02/2021 2.02     Absolute Mono Count 11/02/2021 0.53     Absolute Eos Count 11/02/2021 0.13     Absolute Baso Count 11/02/2021 0.06     Absolute Immature Gran C* 11/02/2021 0.01            Assessment & Plan:        Diagnoses and all orders for this visit:    Routine general medical examination at a health care facility  -     CBC & Auto Differential; Future  -     Comprehensive Metabolic Panel; Future  -     Hgb A1c; Future  -     Lipid Panel; Future  -     TSH with reflex FT4, FT3; Future  -     Vitamin D,25-Hydroxy; Future    Other hyperlipidemia  On statin, stable  Prediabetes  stable  Attention deficit hyperactivity disorder (ADHD), other type  Doing ok on adderall  Sometimes feels ineffect but 1.5 bid - 1 tid is ok for now  Special screening for malignant neoplasms, colon  -     Fecal Immunochemical Test; Future    Asymptomatic menopausal state  -     DXA lumbar spine+hip; Future; Expected date: 09/12/2022    Need for hepatitis B screening test  -     HBS Antigen; Future            F/u pending Dexa otherwise 1 year ok    Author:  Larri Brewton N. Sherryll Burger 09/12/2022 9:19 AM  Isidor Holts has no Advance Directive or POLST in [facility] and has the capacity to identify a surrogate decision maker. Next steps in advance care planning-: Patient identifies as Designated Surrogate husband - Tiffany Richards

## 2022-09-14 MED ORDER — AMPHETAMINE-DEXTROAMPHETAMINE 15 MG PO TABS
15 mg | ORAL_TABLET | Freq: Three times a day (TID) | ORAL | 0 refills
Start: 2022-09-14 — End: ?

## 2022-09-15 MED ORDER — AMPHETAMINE-DEXTROAMPHETAMINE 15 MG PO TABS
15 mg | ORAL_TABLET | Freq: Three times a day (TID) | ORAL | 0 refills | Status: AC
Start: 2022-09-15 — End: ?

## 2022-09-20 ENCOUNTER — Ambulatory Visit: Payer: PRIVATE HEALTH INSURANCE

## 2022-09-21 DIAGNOSIS — L732 Hidradenitis suppurativa: Secondary | ICD-10-CM

## 2022-09-21 MED ORDER — DOXYCYCLINE HYCLATE 100 MG PO CAPS
100 mg | ORAL_CAPSULE | Freq: Every day | ORAL | 0 refills | Status: AC
Start: 2022-09-21 — End: ?

## 2022-09-21 MED ORDER — ATORVASTATIN CALCIUM 20 MG PO TABS
20 mg | ORAL_TABLET | Freq: Every evening | ORAL | 1 refills
Start: 2022-09-21 — End: ?

## 2022-09-23 MED ORDER — ATORVASTATIN CALCIUM 20 MG PO TABS
20 mg | ORAL_TABLET | Freq: Every evening | ORAL | 0 refills
Start: 2022-09-23 — End: ?

## 2022-09-25 MED ORDER — ATORVASTATIN CALCIUM 20 MG PO TABS
20 mg | ORAL_TABLET | Freq: Every evening | ORAL | 0 refills
Start: 2022-09-25 — End: ?

## 2022-09-25 MED ORDER — ATORVASTATIN CALCIUM 20 MG PO TABS
20 mg | ORAL_TABLET | Freq: Every evening | ORAL | 0 refills | Status: AC
Start: 2022-09-25 — End: ?

## 2022-09-26 MED ORDER — ATORVASTATIN CALCIUM 20 MG PO TABS
20 mg | ORAL_TABLET | Freq: Every evening | ORAL | 0 refills
Start: 2022-09-26 — End: ?

## 2022-10-16 MED ORDER — AMPHETAMINE-DEXTROAMPHETAMINE 15 MG PO TABS
15 mg | ORAL_TABLET | Freq: Three times a day (TID) | ORAL | 0 refills
Start: 2022-10-16 — End: ?

## 2022-10-16 MED ORDER — ATORVASTATIN CALCIUM 20 MG PO TABS
20 mg | ORAL_TABLET | Freq: Every evening | ORAL | 0 refills
Start: 2022-10-16 — End: ?

## 2022-10-18 MED ORDER — ATORVASTATIN CALCIUM 20 MG PO TABS
20 mg | ORAL_TABLET | Freq: Every evening | ORAL | 0 refills | Status: AC
Start: 2022-10-18 — End: ?

## 2022-10-18 MED ORDER — AMPHETAMINE-DEXTROAMPHETAMINE 15 MG PO TABS
15 mg | ORAL_TABLET | Freq: Three times a day (TID) | ORAL | 0 refills | Status: AC
Start: 2022-10-18 — End: ?

## 2022-11-15 MED ORDER — ATORVASTATIN CALCIUM 20 MG PO TABS
20 mg | ORAL_TABLET | Freq: Every evening | ORAL | 0 refills
Start: 2022-11-15 — End: ?

## 2022-11-15 MED ORDER — ATORVASTATIN CALCIUM 20 MG PO TABS
ORAL_TABLET | 1 refills | Status: AC
Start: 2022-11-15 — End: ?

## 2022-11-16 MED ORDER — AMPHETAMINE-DEXTROAMPHETAMINE 15 MG PO TABS
15 mg | ORAL_TABLET | Freq: Three times a day (TID) | ORAL | 0 refills | Status: AC
Start: 2022-11-16 — End: ?

## 2022-12-15 MED ORDER — AMPHETAMINE-DEXTROAMPHETAMINE 15 MG PO TABS
15 mg | ORAL_TABLET | Freq: Three times a day (TID) | ORAL | 0 refills
Start: 2022-12-15 — End: ?

## 2022-12-19 MED ORDER — AMPHETAMINE-DEXTROAMPHETAMINE 15 MG PO TABS
15 mg | ORAL_TABLET | Freq: Three times a day (TID) | ORAL | 0 refills | Status: AC
Start: 2022-12-19 — End: ?

## 2023-01-17 MED ORDER — AMPHETAMINE-DEXTROAMPHETAMINE 15 MG PO TABS
15 mg | ORAL_TABLET | Freq: Three times a day (TID) | ORAL | 0 refills
Start: 2023-01-17 — End: ?

## 2023-01-18 MED ORDER — AMPHETAMINE-DEXTROAMPHETAMINE 15 MG PO TABS
15 mg | ORAL_TABLET | Freq: Three times a day (TID) | ORAL | 0 refills | Status: AC
Start: 2023-01-18 — End: ?

## 2023-01-23 ENCOUNTER — Inpatient Hospital Stay: Payer: BLUE CROSS/BLUE SHIELD

## 2023-01-23 DIAGNOSIS — Z780 Asymptomatic menopausal state: Secondary | ICD-10-CM

## 2023-02-14 MED ORDER — ATORVASTATIN CALCIUM 20 MG PO TABS
ORAL_TABLET | 1 refills
Start: 2023-02-14 — End: ?

## 2023-02-15 MED ORDER — ATORVASTATIN CALCIUM 20 MG PO TABS
ORAL_TABLET | 1 refills | Status: AC
Start: 2023-02-15 — End: ?

## 2023-02-16 MED ORDER — AMPHETAMINE-DEXTROAMPHETAMINE 15 MG PO TABS
15 mg | ORAL_TABLET | Freq: Three times a day (TID) | ORAL | 0 refills | Status: AC
Start: 2023-02-16 — End: ?

## 2023-02-19 MED ORDER — AMPHETAMINE-DEXTROAMPHETAMINE 15 MG PO TABS
15 mg | ORAL_TABLET | Freq: Three times a day (TID) | ORAL | 0 refills | Status: AC
Start: 2023-02-19 — End: ?

## 2023-03-06 MED ORDER — DOXYCYCLINE HYCLATE 100 MG PO CAPS
100 mg | ORAL_CAPSULE | Freq: Every day | ORAL | 0 refills | Status: AC
Start: 2023-03-06 — End: ?

## 2023-03-08 MED ORDER — DOXYCYCLINE HYCLATE 100 MG PO CAPS
100 mg | ORAL_CAPSULE | Freq: Every day | ORAL | 0 refills | Status: AC
Start: 2023-03-08 — End: ?

## 2023-03-22 MED ORDER — AMPHETAMINE-DEXTROAMPHETAMINE 15 MG PO TABS
15 mg | ORAL_TABLET | Freq: Three times a day (TID) | ORAL | 0 refills | Status: AC
Start: 2023-03-22 — End: ?

## 2023-04-20 MED ORDER — AMPHETAMINE-DEXTROAMPHETAMINE 15 MG PO TABS
15 mg | ORAL_TABLET | Freq: Three times a day (TID) | ORAL | 0 refills | Status: AC
Start: 2023-04-20 — End: ?

## 2023-04-23 ENCOUNTER — Ambulatory Visit: Payer: BLUE CROSS/BLUE SHIELD

## 2023-04-23 DIAGNOSIS — R41840 Attention and concentration deficit: Secondary | ICD-10-CM

## 2023-04-23 DIAGNOSIS — F908 Attention-deficit hyperactivity disorder, other type: Secondary | ICD-10-CM

## 2023-04-23 MED ORDER — AMPHETAMINE-DEXTROAMPHETAMINE 15 MG PO TABS
15 mg | ORAL_TABLET | Freq: Three times a day (TID) | ORAL | 0 refills | Status: AC
Start: 2023-04-23 — End: ?

## 2023-05-22 MED ORDER — ATORVASTATIN CALCIUM 20 MG PO TABS
ORAL_TABLET | 1 refills
Start: 2023-05-22 — End: ?

## 2023-05-22 MED ORDER — AMPHETAMINE-DEXTROAMPHETAMINE 15 MG PO TABS
15 mg | ORAL_TABLET | Freq: Three times a day (TID) | ORAL | 0 refills
Start: 2023-05-22 — End: ?

## 2023-05-24 ENCOUNTER — Ambulatory Visit: Payer: PRIVATE HEALTH INSURANCE

## 2023-05-24 MED ORDER — AMPHETAMINE-DEXTROAMPHETAMINE 15 MG PO TABS
15 mg | ORAL_TABLET | Freq: Three times a day (TID) | ORAL | 0 refills | Status: AC
Start: 2023-05-24 — End: ?

## 2023-05-24 MED ORDER — ATORVASTATIN CALCIUM 20 MG PO TABS
20 mg | ORAL_TABLET | Freq: Every evening | ORAL | 1 refills | Status: AC
Start: 2023-05-24 — End: ?

## 2023-06-21 ENCOUNTER — Ambulatory Visit: Payer: PRIVATE HEALTH INSURANCE

## 2023-06-21 DIAGNOSIS — R41840 Attention and concentration deficit: Secondary | ICD-10-CM

## 2023-06-21 MED ORDER — AMPHETAMINE-DEXTROAMPHETAMINE 15 MG PO TABS
15 mg | ORAL_TABLET | Freq: Three times a day (TID) | ORAL | 0 refills | Status: AC
Start: 2023-06-21 — End: ?

## 2023-06-21 MED ORDER — AMPHETAMINE-DEXTROAMPHETAMINE 15 MG PO TABS
15 mg | ORAL_TABLET | Freq: Three times a day (TID) | ORAL | 0 refills
Start: 2023-06-21 — End: ?

## 2023-07-15 MED ORDER — DOXYCYCLINE HYCLATE 100 MG PO CAPS
100 mg | ORAL_CAPSULE | Freq: Every day | ORAL | 0 refills
Start: 2023-07-15 — End: ?

## 2023-07-18 MED ORDER — DOXYCYCLINE HYCLATE 100 MG PO CAPS
100 mg | ORAL_CAPSULE | Freq: Every day | ORAL | 0 refills | Status: AC
Start: 2023-07-18 — End: ?

## 2023-07-20 MED ORDER — AMPHETAMINE-DEXTROAMPHETAMINE 15 MG PO TABS
15 mg | ORAL_TABLET | Freq: Three times a day (TID) | ORAL | 0 refills | Status: AC
Start: 2023-07-20 — End: ?

## 2023-08-23 MED ORDER — AMPHETAMINE-DEXTROAMPHETAMINE 15 MG PO TABS
15 mg | ORAL_TABLET | Freq: Three times a day (TID) | ORAL | 0 refills | Status: AC
Start: 2023-08-23 — End: ?

## 2023-09-13 ENCOUNTER — Ambulatory Visit: Payer: PRIVATE HEALTH INSURANCE

## 2023-09-13 DIAGNOSIS — F909 Attention-deficit hyperactivity disorder, unspecified type: Secondary | ICD-10-CM

## 2023-09-13 DIAGNOSIS — Z23 Encounter for immunization: Secondary | ICD-10-CM

## 2023-09-13 DIAGNOSIS — Z0000 Encounter for general adult medical examination without abnormal findings: Secondary | ICD-10-CM

## 2023-09-13 DIAGNOSIS — Z1231 Encounter for screening mammogram for malignant neoplasm of breast: Secondary | ICD-10-CM

## 2023-09-13 DIAGNOSIS — Z1211 Encounter for screening for malignant neoplasm of colon: Secondary | ICD-10-CM

## 2023-09-13 DIAGNOSIS — E7849 Other hyperlipidemia: Secondary | ICD-10-CM

## 2023-09-13 DIAGNOSIS — N3946 Mixed incontinence: Secondary | ICD-10-CM

## 2023-09-13 MED ORDER — DOXYCYCLINE HYCLATE 100 MG PO TABS
100 mg | ORAL_TABLET | Freq: Two times a day (BID) | ORAL | 0 refills | Status: AC
Start: 2023-09-13 — End: 2023-09-14

## 2023-09-13 MED ORDER — ESTRADIOL 0.1 MG/GM VA CREA
2 g | VAGINAL | 3 refills | Status: AC
Start: 2023-09-13 — End: ?

## 2023-09-13 MED ORDER — DOXYCYCLINE HYCLATE 100 MG PO TABS
50 mg | Freq: Two times a day (BID) | ORAL
Start: 2023-09-13 — End: ?

## 2023-09-13 NOTE — Patient Instructions
 Address: [address], [place], [place] [address]  Phone: [phone]    Topical estrogen:  Use 1g daily at bedtime for 2 weeks then decrease use to 2x/week  Monitor for vaginal bleeding - if so, please let me know and stop    Schedule PT    Labs  Mammogram  Stool test  Decrease doxy to 50mg 

## 2023-09-13 NOTE — Progress Notes
 PATIENT: Tiffany Richards  MRN: [id number]  DOB: [birth year]  DATE OF SERVICE: 09/13/2023    REFERRING PRACTITIONER: No ref. provider found  PRIMARY CARE PROVIDER: Katina Dung., MD      CHIEF COMPLAINT:   Chief Complaint   Patient presents with    Annual Exam        Subjective:      Tiffany Richards is a 68 y.o. female here for preventive exam       #sweating  #urinary incontinence  #HS- on doxycycline 100mg     Exercise: walks regularly almost 3-4 miles/day. With weights 5x/week  Nutrition: fairly wel balanced  Mental Health: doing ok  Menses: post menopausal  Family Planning: n/a  STI Screen: n/a    HCM:  Mammogram: 09/2022  Pap smear: n/a s/p hysterecomty  Colonoscopy: last screen 10 years ago. No abnormal or change in BM. No Fhx colon cancer  FIT 09/2022 (-)  Iz:   Bone density: Osteopenia Frax total 11 hip 1.9  L spine -1.5 , L femoral neck -2.2 L femur total -1.7  Advanced Directive: Tiffany Richards [phone].   Daughter Tiffany Richards [phone]  Full Code      Hx of basal cell Ca - s/p Mohs  R knee gave out - led to fall. Previously seeing Ortho benefit with durolane injections but no longer covered  ADD - on adderall 15mg  TID no adr. Long acting triggered mood symptoms  Daughter had baby girl - lives in [place] ([place]). Plans to visit in summer. First grandbaby!  HLD - on atorvastatin 20mg   IFG: A1c 6.0 (stable since 2020)    Past Medical History:   Diagnosis Date    Anxiety     Arthritis     Hyperlipidemia    ,   Past Surgical History:   Procedure Laterality Date    ABDOMINAL HYSTERECTOMY  1995    fibroids, benign.  Per pt, no need for further pap    CESAREAN SECTION  08/27/1991    JOINT REPLACEMENT  10/2014    L knee    TUBAL LIGATION  08/27/1991   ,   Family History   Problem Relation Age of Onset    Breast cancer Mother         late 19s    CABG Father     Epilepsy Brother     Colon cancer Neg Hx     Diabetes Neg Hx    ,   Social History     Socioeconomic History    Marital status: Married   Tobacco Use    Smoking status: Former     Current packs/day: 0.00     Types: Cigarettes     Quit date: 2016     Years since quitting: 9.1    Smokeless tobacco: Never    Tobacco comments:     15 pack years   Substance and Sexual Activity    Alcohol use: Not Currently     Comment: 4 beers per week    Drug use: Never    Sexual activity: Yes     Partners: Male     Birth control/protection: Female Sterilization     Comment: monogamous   Social History Narrative    07/26/2021    Has 3 kids, from [place] then moved to [place] for 12 years, then moved to [place] to live with mom and help her for a few years.  Moved here for husband's job (working on project at [employer]), about 18 months ago.  Kids all grown, do not live here.  Oldest son passed away ~6 years ago from CO poisoning.  Still dealing with the grief, is Catholic but not very religious.  No therapy at this time.      and   Allergies   Allergen Reactions    Sulfa Antibiotics Hives and Rash     Other reaction(s): Rash/Hives/Urticaria  And rash all over  And rash all over  And rash all over    Morphine Hives, Itching and Rash    Hydrocodone Itching and Rash          Review of Systems:   ROS     Objective:        Vitals:  BP 122/81  ~ Pulse (!) 104  ~ Ht 5' 4'' (1.626 m)  ~ Wt 150 lb 9.6 oz (68.3 kg)  ~ SpO2 96%  ~ BMI 25.85 kg/m?      Physical Exam  Vitals and nursing note reviewed.   Constitutional:       General: She is not in acute distress.     Appearance: Normal appearance. She is not ill-appearing, toxic-appearing or diaphoretic.   HENT:      Head: Normocephalic and atraumatic.      Right Ear: Tympanic membrane normal.      Left Ear: Tympanic membrane normal.   Eyes:      General: No scleral icterus.     Conjunctiva/sclera: Conjunctivae normal.      Pupils: Pupils are equal, round, and reactive to light.   Neck:      Thyroid: No thyromegaly or thyroid tenderness.      Vascular: No carotid bruit.   Cardiovascular:      Rate and Rhythm: Normal rate and regular rhythm.      Heart sounds: No murmur heard.  Pulmonary:      Effort: Pulmonary effort is normal.      Breath sounds: Normal breath sounds.   Abdominal:      Palpations: Abdomen is soft.      Tenderness: There is no abdominal tenderness.   Musculoskeletal:         General: Normal range of motion.      Cervical back: Normal range of motion.      Right lower leg: No edema.      Left lower leg: No edema.   Skin:     General: Skin is warm and dry.      Capillary Refill: Capillary refill takes less than 2 seconds.   Neurological:      General: No focal deficit present.      Mental Status: She is alert and oriented to person, place, and time.      Motor: Motor function is intact.      Coordination: Coordination is intact.   Psychiatric:         Attention and Perception: Attention normal.         Mood and Affect: Mood normal.         Speech: Speech normal.         Behavior: Behavior normal.         Thought Content: Thought content normal.         Cognition and Memory: Cognition normal.         Judgment: Judgment normal.         Lab Review:   No visits with results within 2 Month(s) from this visit.   Latest known visit with results is:  Lab Visit on 10/12/2022   Component Date Value    Fecal Immunochemical Test 10/05/2022 Negative            Assessment & Plan:        Diagnoses and all orders for this visit:    Assessment and Plan:    Routine general medical examination at a health care facility  -     CBC  -     Comprehensive Metabolic Panel  -     Hgb A1c  -     Lipid Panel    Hidradenitis  Decrease doxy to 50mg  bid  Attention deficit hyperactivity disorder (ADHD), unspecified ADHD type  C/w adderall 15mg  TID  Adr with long acting  HR elevated in office but home HR monitor shows 60-70s  Prediabetes  -     Hgb A1c   -stable for many years. monitor  Other hyperlipidemia  -     Lipid Panel   C/w atorvastatin consider dose increase for LDL goal < 100  Encounter for screening mammogram for malignant neoplasm of breast  -     Mammo tomosynthesis, screening, bilat breast    Special screening for malignant neoplasms, colon  -     Fecal Immunochemical Test    Need for prophylactic vaccination against Streptococcus pneumoniae (pneumococcus)  -     pneumococcal conjugate vaccine 20-valent (PREVNAR 20) - preferred vaccine for all individuals >=50    Mixed incontinence urge and stress  -     Referral to [facility], Physical Therapy  -     estradiol (ESTRACE) 0.1 mg/g vaginal cream                F/u 45M - vaginal atrophy/add/mixed incontinence    Author:  Rechelle Niebla N. Sherryll Burger 09/13/2023 9:12 AM  Isidor Holts has no Advance Directive or POLST in [facility] and has the capacity to identify a surrogate decision maker. Next steps in advance care planning-: Patient identifies as Designated Surrogate husband - Irys Nigh

## 2023-09-19 ENCOUNTER — Other Ambulatory Visit: Payer: PRIVATE HEALTH INSURANCE

## 2023-09-19 ENCOUNTER — Ambulatory Visit: Payer: PRIVATE HEALTH INSURANCE

## 2023-09-19 DIAGNOSIS — R7989 Other specified abnormal findings of blood chemistry: Secondary | ICD-10-CM

## 2023-09-20 MED ORDER — AMPHETAMINE-DEXTROAMPHETAMINE 15 MG PO TABS
15 mg | ORAL_TABLET | Freq: Three times a day (TID) | ORAL | 0 refills | Status: AC
Start: 2023-09-20 — End: ?

## 2023-10-15 ENCOUNTER — Inpatient Hospital Stay: Payer: BLUE CROSS/BLUE SHIELD

## 2023-10-15 DIAGNOSIS — Z1231 Encounter for screening mammogram for malignant neoplasm of breast: Secondary | ICD-10-CM

## 2023-10-22 MED ORDER — AMPHETAMINE-DEXTROAMPHETAMINE 15 MG PO TABS
15 mg | ORAL_TABLET | Freq: Three times a day (TID) | ORAL | 0 refills
Start: 2023-10-22 — End: ?

## 2023-10-23 MED ORDER — AMPHETAMINE-DEXTROAMPHETAMINE 15 MG PO TABS
15 mg | ORAL_TABLET | Freq: Three times a day (TID) | ORAL | 0 refills | Status: AC
Start: 2023-10-23 — End: ?

## 2023-11-13 ENCOUNTER — Other Ambulatory Visit: Payer: BLUE CROSS/BLUE SHIELD

## 2023-11-14 MED ORDER — ESTRADIOL 0.1 MG/GM VA CREA
2 g | VAGINAL | 3 refills | 30.00000 days | Status: AC
Start: 2023-11-14 — End: ?

## 2023-11-19 MED ORDER — ATORVASTATIN CALCIUM 20 MG PO TABS
ORAL_TABLET | 1 refills
Start: 2023-11-19 — End: ?

## 2023-11-20 MED ORDER — ATORVASTATIN CALCIUM 20 MG PO TABS
20 mg | ORAL_TABLET | Freq: Every evening | ORAL | 1 refills | 90.00000 days | Status: AC
Start: 2023-11-20 — End: ?

## 2023-11-21 MED ORDER — AMPHETAMINE-DEXTROAMPHETAMINE 15 MG PO TABS
15 mg | ORAL_TABLET | Freq: Three times a day (TID) | ORAL | 0 refills | 30.00000 days | Status: AC
Start: 2023-11-21 — End: ?

## 2023-11-30 ENCOUNTER — Ambulatory Visit: Payer: PRIVATE HEALTH INSURANCE

## 2023-12-21 MED ORDER — AMPHETAMINE-DEXTROAMPHETAMINE 15 MG PO TABS
15 mg | ORAL_TABLET | Freq: Three times a day (TID) | ORAL | 0 refills | 30.00000 days | Status: AC
Start: 2023-12-21 — End: ?

## 2024-01-22 MED ORDER — AMPHETAMINE-DEXTROAMPHETAMINE 15 MG PO TABS
15 mg | ORAL_TABLET | Freq: Three times a day (TID) | ORAL | 0 refills
Start: 2024-01-22 — End: ?

## 2024-01-23 MED ORDER — AMPHETAMINE-DEXTROAMPHETAMINE 15 MG PO TABS
15 mg | ORAL_TABLET | Freq: Three times a day (TID) | ORAL | 0 refills | 30.00000 days | Status: AC
Start: 2024-01-23 — End: ?

## 2024-02-22 MED ORDER — AMPHETAMINE-DEXTROAMPHETAMINE 15 MG PO TABS
15 mg | ORAL_TABLET | Freq: Three times a day (TID) | ORAL | 0 refills
Start: 2024-02-22 — End: ?

## 2024-02-26 MED ORDER — AMPHETAMINE-DEXTROAMPHETAMINE 15 MG PO TABS
15 mg | ORAL_TABLET | Freq: Three times a day (TID) | ORAL | 0 refills | 30.00000 days | Status: AC
Start: 2024-02-26 — End: ?

## 2024-03-14 DIAGNOSIS — N3946 Mixed incontinence: Principal | ICD-10-CM

## 2024-03-14 NOTE — Progress Notes
 [facility] NOTE    PATIENT: Tiffany Richards  MRN: [id number]  DOB: 09/30/1955  DATE OF SERVICE: 03/17/2024      PRIMARY CARE PROVIDER: Maree Marella SAILOR., MD  REFERRING PROVIDER: No ref. provider found    Chief Complaint   Patient presents with    Follow-up     6 month f/u life style modifications. Cutting caffeine, lost 3 lbs      Patient Active Problem List   Diagnosis    Hyperlipemia    Attention deficit disorder    Prediabetes    Former smoker    Controlled substance agreement signed    DJD (degenerative joint disease)    Hidradenitis       Subjective:   Tiffany Richards is a 68 y.o. female with above PMH who presents for follow-up:    #Urge incontinence  - insurance didn't cover pelvic floor PT  - estrace  has helped significantly  - cutting out caffeine  - has lost 3 lbs  - has been exercising: walking, dumbbells  - doing kegels at home    #ADHD  - on adderall 15 mg TID    #HS  - only using doxycycline  PRN - only if areas get inflamed  - not using frequently      Past medical, surgical, family, and social history were reviewed and non-contributory except as noted in HPI.    Objective (click to expand/collapse)     Outpatient Medications Marked as Taking for the 03/17/24 encounter (Office Visit) with Laurence Malta, MD, MS   Medication    amphetamine -dextroamphetamine  15 mg tablet    ascorbic acid 500 mg tablet    Calcium  Carbonate-Vitamin D 600-400 MG-UNIT per tablet    Coenzyme Q10 100 MG capsule    diclofenac 1% gel    doxycycline  hyclate 100 mg tablet    estradiol  (ESTRACE ) 0.1 mg/g vaginal cream    Multiple Vitamin (THERA-TABS) TABS    polyethylene glycol powder    vitamin E 400 unit capsule    [DISCONTINUED] atorvastatin  20 mg tablet    atorvastatin  20 mg tablet       Allergies   Allergen Reactions    Sulfa Antibiotics Hives and Rash     Other reaction(s): Rash/Hives/Urticaria  And rash all over  And rash all over  And rash all over    Morphine Hives, Itching and Rash    Hydrocodone Itching and Rash Review of Systems:  As per HPI    Objective:   BP 114/77  ~ Pulse 95  ~ Temp 36.7 ?C (98 ?F) (Forehead)  ~ Wt 146 lb 6.4 oz (66.4 kg)  ~ SpO2 98%  ~ BMI 25.13 kg/m?       System Checked if completed, normal if otherwise not stated Positive or additional negative findings   Constit  []  General appearance     Eyes  []  Conj/Lids []  Pupils  []  Fundi     HENMT  []  External ears/nose []  Otoscopy   []  Gross Hearing []  Nasal mucosa   []  Lips/teeth/gums []  Oropharynx    []  mucus membranes []  Head     Neck  []  Inspection/palpation []  Thyroid     Resp  []  Effort    []  Auscultation       CV  []  Rhythm/rate   []  Murmurs   []  LEE   []  JVP non-elevated    Normal pulses:   []  Radial []  Femoral  []  Pedal     Breast  []   Inspection []  Palpation     GI  []  Bowel sounds/auscultation of abd    []  abd masses    []  tenderness   []  rebound/guarding   []  Liver/spleen []  Rectal     GU  M: []  Scrotum []  Penis []  Prostate   F:  []  External []  vaginal wall        []  Cervix  []  mucus        []  Uterus    []  Adnexa      Lymph  []  Neck []  Axillae []  Groin     MSK Specify site examined:    []  Inspect/palp []  ROM   []  Stability []  Strength/tone         Skin  []  Inspection []  Palpation     Neuro  []  CN2-12 intact grossly   []  Alert and oriented   []  DTR      []  Muscle strength      []  Sensation   []  Gait/balance     Psych  []  Insight/judgement     []  Mood/affect    []  Gross cognition        Lab Studies:   Last CBC:   Results for orders placed or performed in visit on 09/29/22   CBC   Result Value Ref Range    White Blood Cell Count 6.30 4.16 - 9.95 x10E3/uL    Red Blood Cell Count 4.86 3.96 - 5.09 x10E6/uL    Hemoglobin 15.1 11.6 - 15.2 g/dL    Hematocrit 53.3 (H) 34.9 - 45.2 %    Mean Corpuscular Volume 95.9 79.3 - 98.6 fL    Mean Corpuscular Hemoglobin 31.1 26.4 - 33.4 pg    MCH Concentration 32.4 31.5 - 35.5 g/dL    Red Cell Distribution Width-SD 46.2 36.9 - 48.3 fL    Red Cell Distribution Width-CV 12.9 11.1 - 15.5 %    Platelet Count, Auto 383 143 - 398 x10E3/uL    Mean Platelet Volume 10.2 9.3 - 13.0 fL    Nucleated RBC%, automated 0.0 No Ref. Range %    Absolute Nucleated RBC Count 0.00 0.00 - 0.00 x10E3/uL    Neutrophil Abs (Prelim) 2.96 See Absolute Neut Ct. x10E3/uL   Differential, Automated   Result Value Ref Range    Neutrophil Percent, Auto 46.9 No Ref. Range %    Lymphocyte Percent, Auto 39.8 No Ref. Range %    Monocyte Percent, Auto 8.4 No Ref. Range %    Eosinophil Percent, Auto 3.7 No Ref. Range %    Basophil Percent, Auto 1.0 No Ref. Range %    Immature Granulocytes% 0.2 No Reference Range %    Absolute Neut Count 2.96 1.80 - 6.90 x10E3/uL    Absolute Lymphocyte Count 2.51 1.30 - 3.40 x10E3/uL    Absolute Mono Count 0.53 0.20 - 0.80 x10E3/uL    Absolute Eos Count 0.23 0.00 - 0.50 x10E3/uL    Absolute Baso Count 0.06 0.00 - 0.10 x10E3/uL    Absolute Immature Gran Count 0.01 0.00 - 0.04 x10E3/uL   CBC & Auto Differential    Narrative    The following orders were created for panel order CBC & Auto Differential.  Procedure                               Abnormality         Status                     ---------                               -----------         ------  RAR[[id number]]                          Abnormal            Final result               Differential, Automated[[id number]]                          Final result                 Please view results for these tests on the individual orders.     Last CMP:   Results for orders placed or performed in visit on 09/18/23   Comprehensive Metabolic Panel   Result Value Ref Range    Sodium 141 135 - 146 mmol/L    Potassium 4.6 3.6 - 5.3 mmol/L    Chloride 103 96 - 106 mmol/L    Total CO2 22 20 - 30 mmol/L    Anion Gap 16 8 - 19 mmol/L    Glucose 113 (H) 65 - 99 mg/dL    Creatinine 9.20 9.39 - 1.30 mg/dL    Estimated GFR 82 See GFR Additional Information mL/min/1.35m2    GFR Additional Information See Comment     Urea Nitrogen 15 7 - 22 mg/dL    Calcium  9.5 8.6 - 10.4 mg/dL Total Protein 7.2 6.1 - 8.2 g/dL    Albumin 4.6 3.9 - 5.0 g/dL    Bilirubin,Total 0.6 0.1 - 1.2 mg/dL    Alkaline Phosphatase 90 37 - 133 U/L    Aspartate Aminotransferase 27 13 - 62 U/L    Alanine Aminotransferase 22 8 - 70 U/L     Last Hgb A1C:   Lab Results   Component Value Date/Time    HGBA1C 5.8 (H) 09/18/2023 09:15 AM     Last Lipids:   Results for orders placed or performed in visit on 09/18/23   Lipid Panel   Result Value Ref Range    Cholesterol 182 See Comment mg/dL    Cholesterol,LDL,Calc 91 <100 mg/dL    Cholesterol, HDL 71 >50 mg/dL    Triglycerides 898 <849 mg/dL    Non-HDL,Chol,Calc 888 <130 mg/dL     Last TSH:   Lab Results   Component Value Date/Time    TSH 0.96 09/29/2022 08:04 AM     Last fT4:      Imaging Studies:   None         Assessment/Plan:   Diagnoses and all orders for this visit:    Mixed incontinence urge and stress  Reports significantly improved, on estrace  cream.  - continue estrace  cream  - handouts given for at home pelvic floor exercises     Prediabetes  -     Hgb A1c; Future    Other hyperlipidemia  Last LDL at goal 09/2023.  -     Lipid Panel; Future  -     atorvastatin  20 mg tablet; Take 1 tablet (20 mg total) by mouth at bedtime.    Other specified attention deficit hyperactivity disorder (ADHD)  Stable, on adderall TID.    Elevated platelet count  -     CBC & Auto Differential; Future    Hidradenitis  Reports overall well controlled. Only taking doxycycline  infrequently for flare-ups.        Return in about 6 months (around 09/14/2024) for CPE with Dr. Maree.    Author:  Gari Door, MD, MS 03/17/2024 9:17 AM

## 2024-03-17 ENCOUNTER — Ambulatory Visit: Payer: PRIVATE HEALTH INSURANCE | Attending: Student in an Organized Health Care Education/Training Program

## 2024-03-17 DIAGNOSIS — F908 Attention-deficit hyperactivity disorder, other type: Secondary | ICD-10-CM

## 2024-03-17 DIAGNOSIS — E7849 Other hyperlipidemia: Secondary | ICD-10-CM

## 2024-03-17 DIAGNOSIS — R7989 Other specified abnormal findings of blood chemistry: Secondary | ICD-10-CM

## 2024-03-17 MED ORDER — ATORVASTATIN CALCIUM 20 MG PO TABS
20 mg | ORAL_TABLET | Freq: Every evening | ORAL | 1 refills | 90.00000 days | Status: AC
Start: 2024-03-17 — End: ?

## 2024-03-17 NOTE — Patient Instructions
 Pelvic Floor Muscle Exercises and Bladder Training  If you are experiencing urinary leakage, pelvic floor muscle exercises (Kegels) and bladder training are two things you can do to help control your urinary symptoms.    Pelvic Floor Muscle Exercises    Learning how to strengthen the pelvic floor muscles can help reduce or stop urine leakage. Pelvic floor muscle exercies (PFME) are most helpful for women with mild to moderate urine leakage. Women with severe urine leakage often need more than exercises to improve.  Like any other form of exercise, it is important to do PMFE correctly and regularly. Unfortunately, because pelvic muscles are hidden from view, it is hard to know if you are doing them correctly. To be sure that you are working the right muscles:  Imagine you are going to pass gas and squeeze the muscles that would prevent that gas from escaping your rectum. Exercising the muscles around the rectum, will also strengthen those around the vagina and under the bladder.  Use a hand mirror to look at your vaginal opening and the perineum (the space between the vagina and rectum). You should see the perineum lift up when you contract your pelvic muscles.  While lying or sitting, place one finger inside your vagina. Squeeze as if you were trying to stop urine from coming out. You should feel your finger lifted and squeezed if you are correctly contracting your pelvic muscles.  No one should be able to tell you are doing PFME - if you are visibly moving, you are not using the right muscles.  You should not be contracting the gluteus (''butt'') or thigh muscles.    Keep your stomach and back muscles relaxed as you work those pelvic muscles. And, do not squeeze your legs together or hold your breath while doing the exercises. Try this routine:  1) Start by pulling in and holding a pelvic muscle squeeze for about 3 seconds then relax for an equal amount of time (3 seconds).  2) Do this for 10 repetitions three times a day.  3) Try to hold for 1 second longer each week until you are holding for a 10 second squeeze.  4) Remember to rest and breathe between contractions. Relaxing can be as important as squeezing.  5) At the beginning, do the exercises while lying down. As you get stronger; do the exercises while sitting or standing.    If you are not sure that you are doing the exercises correctly, ask your medical provider at a pelvic exam to check if your squeeze is working the right muscles. Or, ask for a referral to a physical therapist who specializes in pelvic floor muscle rehabilitation. The physical therapist will also check your back and abdominal strength, your gait and your posture. These all affect how your pelvic muscles work.    About Bladder Training  Now that you've got the right muscles and good strength, it's time to put that muscle to work!  Normally, the bladder can hold urine for 2-4 hours- then you feel an urge and should be able to walk to the bathroom and urinate normally. Women with overactive bladder feel a sudden urge to urinate immediately, which is called urgency. This urgency may lead to urine leakage. Bladder training can help hold the urine longer and overcome that gotta-go sensation without medicines or surgery.    CONTRACTING BLADDER      Bladder training programs involve urinating on a schedule. Over time, you increase the time between bathroom trips.  This helps to increase the amount of urine that your bladder can hold. The goal of bladder training is to feel less need to rush to the bathroom frequently. Here are the steps to better bladder control:  STEP 1: Talk with your provider about your bladder symptoms. Get checked for a bladder infection or other health issues that can cause these symptoms. Keep a bladder diary. Write down the time when you urinate, how much you urinated, as well as what and how much you drink. In addition, log every time you feel the need to go.  STEP 2: Review the diary with your provider and decide on the best approach to bladder training. Make a commitment to get your bladder in shape either on your own or with help. Research shows bladder training reduces frequency and urgency. But, just like other exercise programs, bladder training requires motivation. With practice this will get easier. Ask about working with a physical therapist if you think that would help you.  STEP 3: Make a training schedule. Most women start by urinating every 30 to 60 minutes during the day- whether or not you feel the need to go. If you get the urge to go before the scheduled time, do not run to the bathroom. Try some of the following strategies:  Practice your PFME squeeze as explained above. This helps to close off the urethra, preventing urine from leaking. Continue to squeeze until the need to go fades. Another option is to quickly squeeze and release the muscles, distracting the bladder from squeezing.  Cross your legs or sit on a hard surface.  Distract your mind. Count backwards from 100.  Shift your position. You might find that leaning forward helps to settle your bladder.  STEP 4: After 1 to 2 weeks, if you are not having leaking accidents, increase the time between bathroom trips by 30 minutes. Stop making those ''just in case'' visits to the bathroom.  STEP 5:  Be patient and stick with it. You may notice improvement within a couple of weeks. However, the bladder retraining period can take several months. Below are additional tips to help make your bladder training a success.  Maintain a normal body weight. If you are overweight, losing a small amount of weight can help with bladder leakage. Extra weight can increase the urge to go.  If you smoke, quit. Smoking can lead to lung problems, which can make you cough often. Coughing can also promote urinary leakage. Nicotine can also cause bladder spasms.  Treat constipation. Constipation can make Overactive Bladder (OAB) and urine leakage worse.  Learn about side effects of medicines. Ask your provider if any of your prescribed or over-the-counter medicines might be worsening the gotta-go feeling. If so, discuss alternate options.  Drink when you are thirsty, but do not overdrink. Drink only enough so that your urine is a light yellow color. Spread your drinks across the day to help control your need to urinate.  Stop drinking two hours before you go to bed. This will help to decrease your need to get up during the night to use the bathroom.  Stop or limit any drinks that irritate your bladder. This can include drinks with alcohol and caffeine, artificial sweeteners or diet drinks.    Three Takeaways  1) By exercising your bladder and pelvic floor muscles, you can help control the urge to urinate.  2) Bladder training is effective for many women- stick with your schedule and give yourself time to relearn when  to respond to the need to go. This can mean you avoid medicines and surgery.  3) When done correctly, pelvic floor muscle exercises can help reduce or stop urine leakage. If you are unsure about your technique, ask for a referral to a pelvic floor physical therapist.    www.voicesforp[employer].org  Copyright 2016 [employer]. All rights reserved.    ----------  Lifestyle and Behavioral Changes - Improving Urinary Urgency, Frequency and Urge Incontinence  Manage your Fluid Intake  There is no scientific evidence that states we need eight 8 oz. glasses (64 oz.) of fluid every day. Remember, what goes in must come out! Many women, unless you exercise heavily or work in hot conditions can drink less than 64 oz. per day. In 2004, the Institutes of Medicine reported that most people meet their daily hydration needs by letting their thirst be their guide. You must also remember that we get additional fluids from our diets in the form of soups, stews, fruits, etc. It has been shown that we get as much as 20% of our daily fluids from our diet. If you are used to drinking large amounts of fluids every day and you are bothered by how frequently you need to go to the bathroom, these suggestions may help you:  Don?t carry a water bottle or large container of fluid around with you  Use a smaller glass or cup  Take smaller sips of fluids instead of large gulp  If your mouth is dry, try sugar free gum or candy  Try spreading out fluids during the day instead of drinking large amounts at one time. This is especially important before leaving the house. If you get up to void more than 2 times per night, you should limit your drinking after dinner.    Avoid Fluids that can be Bladder Irritants  Some chemicals in our beverages can behave as diuretics and bladder irritants. If you are sensitive to these chemicals, they may cause you to make large amounts of urine or may aggravate bladder spasms resulting in a more frequent need to urinate. Some common bladder irritants include:  Caffeine - Try to stop or at least reduce your caffeinated beverages like coffee, tea, and cola to see if your bladder control improves. If you drink a lot of caffeine, you should taper down slowly to avoid a caffeine withdrawal headache.  Artificial Sweeteners - Beverages that contain artificial sweeteners like aspartame or saccharin can also be a bladder irritant. Diet Pepsi, [facility] or Coke then would be especially problematic because of the artificial sweetener and the caffeine.  Citrus juices - Some people find that juices like [place] or grapefruit juice can also irritate their bladder. Although there are no scientific studies to prove this, the best thing to do is to stop the suspected irritant for a week or two and see if it makes a difference.    Weight Loss  Being overweight puts extra pressure on your bladder. Weight loss will relieve some of that pressure and will help you regain your bladder control.    Void on a Schedule  Sometimes, the message that the bladder is full comes without warning and often too late. In these cases, women find that they lose urine on the way to the bathroom. There isn?t enough time between the message and their ability to get to the bathroom before they start to leak. Voiding on a schedule, also referred to as ?Timed Voids? may help prevent these leaking episodes.  It is exactly what it sounds like. You urinate on a schedule, sometimes even when you don?t feel like you have to so that you are not caught off guard. Completing a Bladder diary helps to determine when you usually leak and what is a reasonable period of time between trips to the bathroom. Slowly, you can stretch the time between trip to the bathroom until you are voiding every 3 or 4 hours. Often times women find that keeping a bladder diary helps them be more consistent with their schedule. Your doctor or health care clinician can help you determine your best schedule if you are having a difficult time figuring it out.    Strengthen your Pelvic Floor Muscles with Kegel Exercise  Most bladder control problems are caused by weak pelvic muscles. These pelvic floor muscles attach to the bones of the pelvis in a way that creates a trampoline of support for the pelvic organs. These muscles help prevent urine leakage. Pregnancy, childbirth, increasing age all weaken the muscles of the pelvic floor. Exercising the pelvic floor muscles can strengthen the pelvic muscles and improve bladder control. Identifying the correct muscles to exercise is important. These are the same muscles you would use to hold back gas or to stop the flow of urine midstream. Your doctor or nurse can help make sure that you are contracting the right muscles. Once you have correctly identified the muscles, you contract and hold the squeeze for a few seconds and then completely relax the muscles before the next squeeze. Expect that it will take about 6 to 8 weeks of exercising before you notice that you have fewer leaks and more bladder control.    Urge Suppression Strategies - ''Freeze and Squeeze'':  If you have trouble reaching the bathroom before you start losing urine, we recommend trying this technique. When you get the urge to urinate:  Stop and stay still, sit down if you can  Squeeze your pelvic floor muscles quickly 3 to 5 times; repeat as needed  Relax the rest of your body and take a deep breath  Concentrate on suppressing the urge  Distract yourself to get your mind on something else  Wait until the urge subsides, then walk to the bathroom at a normal pace  Don?t ignore the message    Bladder Training:  Once you have mastered the Urge Suppression technique, you can now train your bladder to increase the time between the initial urge and the time you actually void. Simply follow the Urge Suppression technique, but instead of walking calmly to the bathroom at your normal pace, you will wait a few minutes before voiding. At first you may only be able to postpone voiding by 1 minute, but keep trying to increase the interval between the initial urge and the time you actually void until you are only voiding every 3 to 4 hours. Like any new technique, this takes practice and time to master, so we recommend trying this at home initially until you become more successful.    [employer] ~ [address] ~ [place], [place] [address] ~ P [phone]  [email or web address] ~ [email or web address]    If I have ordered labs for you, please make sure to confirm with your insurance that they are covered as plan coverage can vary.    Please be aware that I do not typically check messages after hours and on weekends as this portal is for non-urgent messages.  If you have an urgent  concern, please call the clinic for assistance.     For labs, you can walk into [facility] on the 1st floor of our building (no appointment needed) -      [facility]              [address]              [place], [place] [address] Hours: Monday - Friday, 8 am - 5 pm    You can text [phone] with ''[email or web address]'' ahead to get in line virtually to minimize waiting in line in person.  You will then receive text message to confirm your place in line (with estimate on wait time).  You also can download the ''QLess'' app, and it will go through similar process to get you in line virtually.  Please note that lines may be longer in the mornings.

## 2024-03-26 MED ORDER — AMPHETAMINE-DEXTROAMPHETAMINE 15 MG PO TABS
15 mg | ORAL_TABLET | Freq: Three times a day (TID) | ORAL | 0 refills
Start: 2024-03-26 — End: ?

## 2024-03-27 MED ORDER — AMPHETAMINE-DEXTROAMPHETAMINE 15 MG PO TABS
15 mg | ORAL_TABLET | Freq: Three times a day (TID) | ORAL | 0 refills | 30.00000 days | Status: AC
Start: 2024-03-27 — End: ?

## 2024-04-25 MED ORDER — AMPHETAMINE-DEXTROAMPHETAMINE 15 MG PO TABS
15 mg | ORAL_TABLET | Freq: Three times a day (TID) | ORAL | 0 refills
Start: 2024-04-25 — End: ?

## 2024-04-28 MED ORDER — AMPHETAMINE-DEXTROAMPHETAMINE 15 MG PO TABS
15 mg | ORAL_TABLET | Freq: Three times a day (TID) | ORAL | 0 refills | 30.00000 days | Status: AC
Start: 2024-04-28 — End: ?

## 2024-05-15 MED ORDER — ATORVASTATIN CALCIUM 20 MG PO TABS
ORAL_TABLET | 1 refills
Start: 2024-05-15 — End: ?

## 2024-05-16 MED ORDER — ATORVASTATIN CALCIUM 20 MG PO TABS
ORAL_TABLET | 1 refills
Start: 2024-05-16 — End: ?

## 2024-05-27 MED ORDER — AMPHETAMINE-DEXTROAMPHETAMINE 15 MG PO TABS
15 mg | ORAL_TABLET | Freq: Three times a day (TID) | ORAL | 0 refills | 30.00000 days | Status: AC
Start: 2024-05-27 — End: ?

## 2024-06-25 MED ORDER — AMPHETAMINE-DEXTROAMPHETAMINE 15 MG PO TABS
15 mg | ORAL_TABLET | Freq: Three times a day (TID) | ORAL | 0 refills | Status: AC
Start: 2024-06-25 — End: ?

## 2024-07-25 MED ORDER — AMPHETAMINE-DEXTROAMPHETAMINE 15 MG PO TABS
15 mg | ORAL_TABLET | Freq: Three times a day (TID) | ORAL | 0 refills
Start: 2024-07-25 — End: ?

## 2024-07-27 MED ORDER — AMPHETAMINE-DEXTROAMPHETAMINE 15 MG PO TABS
15 mg | ORAL_TABLET | Freq: Three times a day (TID) | ORAL | 0 refills | 30.00000 days | Status: AC
Start: 2024-07-27 — End: ?

## 2024-07-31 ENCOUNTER — Other Ambulatory Visit: Payer: PRIVATE HEALTH INSURANCE

## 2024-07-31 ENCOUNTER — Other Ambulatory Visit: Payer: BLUE CROSS/BLUE SHIELD
